# Patient Record
Sex: Female | Born: 1945 | Race: White | Hispanic: No | Marital: Married | Smoking: Never smoker
Health system: Southern US, Community
[De-identification: ages and names within clinical notes are randomized; demographics above are authoritative.]

## PROBLEM LIST (undated history)

## (undated) DIAGNOSIS — E039 Hypothyroidism, unspecified: Secondary | ICD-10-CM

## (undated) DIAGNOSIS — M1990 Unspecified osteoarthritis, unspecified site: Secondary | ICD-10-CM

## (undated) HISTORY — PX: ABDOMINAL HYSTERECTOMY: SHX81

## (undated) HISTORY — PX: NASAL SINUS SURGERY: SHX719

## (undated) HISTORY — PX: OTHER SURGICAL HISTORY: SHX169

## (undated) HISTORY — PX: BUNIONECTOMY: SHX129

## (undated) HISTORY — PX: BACK SURGERY: SHX140

---

## 2000-12-22 ENCOUNTER — Ambulatory Visit (HOSPITAL_COMMUNITY): Admission: RE | Admit: 2000-12-22 | Discharge: 2000-12-22 | Payer: Self-pay | Admitting: Neurosurgery

## 2000-12-22 ENCOUNTER — Encounter: Payer: Self-pay | Admitting: Neurosurgery

## 2001-05-06 ENCOUNTER — Other Ambulatory Visit: Admission: RE | Admit: 2001-05-06 | Discharge: 2001-05-06 | Payer: Self-pay | Admitting: *Deleted

## 2001-05-22 ENCOUNTER — Ambulatory Visit (HOSPITAL_COMMUNITY): Admission: RE | Admit: 2001-05-22 | Discharge: 2001-05-22 | Payer: Self-pay | Admitting: *Deleted

## 2001-05-22 ENCOUNTER — Encounter: Payer: Self-pay | Admitting: *Deleted

## 2002-06-16 ENCOUNTER — Ambulatory Visit (HOSPITAL_COMMUNITY): Admission: RE | Admit: 2002-06-16 | Discharge: 2002-06-16 | Payer: Self-pay | Admitting: *Deleted

## 2002-06-16 ENCOUNTER — Encounter: Payer: Self-pay | Admitting: *Deleted

## 2002-07-13 ENCOUNTER — Encounter: Admission: RE | Admit: 2002-07-13 | Discharge: 2002-07-13 | Payer: Self-pay | Admitting: *Deleted

## 2002-07-13 ENCOUNTER — Encounter: Payer: Self-pay | Admitting: *Deleted

## 2003-08-19 ENCOUNTER — Emergency Department (HOSPITAL_COMMUNITY): Admission: EM | Admit: 2003-08-19 | Discharge: 2003-08-20 | Payer: Self-pay | Admitting: Emergency Medicine

## 2003-08-20 ENCOUNTER — Other Ambulatory Visit (HOSPITAL_COMMUNITY): Admission: RE | Admit: 2003-08-20 | Discharge: 2003-09-01 | Payer: Self-pay | Admitting: Psychiatry

## 2003-09-08 ENCOUNTER — Encounter: Admission: RE | Admit: 2003-09-08 | Discharge: 2003-09-08 | Payer: Self-pay | Admitting: Psychiatry

## 2004-04-02 HISTORY — PX: BREAST ENHANCEMENT SURGERY: SHX7

## 2004-04-02 HISTORY — PX: AUGMENTATION MAMMAPLASTY: SUR837

## 2006-07-24 ENCOUNTER — Encounter: Admission: RE | Admit: 2006-07-24 | Discharge: 2006-07-24 | Payer: Self-pay | Admitting: Obstetrics and Gynecology

## 2006-08-05 ENCOUNTER — Encounter: Admission: RE | Admit: 2006-08-05 | Discharge: 2006-08-05 | Payer: Self-pay | Admitting: Obstetrics and Gynecology

## 2007-09-10 ENCOUNTER — Encounter: Admission: RE | Admit: 2007-09-10 | Discharge: 2007-09-10 | Payer: Self-pay | Admitting: Obstetrics and Gynecology

## 2008-10-20 ENCOUNTER — Ambulatory Visit (HOSPITAL_COMMUNITY): Admission: RE | Admit: 2008-10-20 | Discharge: 2008-10-20 | Payer: Self-pay | Admitting: Family Medicine

## 2009-10-28 ENCOUNTER — Ambulatory Visit: Payer: Self-pay | Admitting: Hematology & Oncology

## 2009-11-11 LAB — CBC WITH DIFFERENTIAL (CANCER CENTER ONLY)
BASO#: 0.1 10*3/uL (ref 0.0–0.2)
BASO%: 0.8 % (ref 0.0–2.0)
EOS%: 1.6 % (ref 0.0–7.0)
Eosinophils Absolute: 0.1 10*3/uL (ref 0.0–0.5)
HCT: 45.6 % (ref 34.8–46.6)
HGB: 15.4 g/dL (ref 11.6–15.9)
LYMPH#: 2 10*3/uL (ref 0.9–3.3)
LYMPH%: 33.2 % (ref 14.0–48.0)
MCH: 31.6 pg (ref 26.0–34.0)
MCHC: 33.7 g/dL (ref 32.0–36.0)
MCV: 94 fL (ref 81–101)
MONO#: 0.4 10*3/uL (ref 0.1–0.9)
MONO%: 5.9 % (ref 0.0–13.0)
NEUT#: 3.5 10*3/uL (ref 1.5–6.5)
NEUT%: 58.5 % (ref 39.6–80.0)
Platelets: 251 10*3/uL (ref 145–400)
RBC: 4.87 10*6/uL (ref 3.70–5.32)
RDW: 12.4 % (ref 10.5–14.6)
WBC: 5.9 10*3/uL (ref 3.9–10.0)

## 2009-11-14 LAB — RETICULOCYTES (CHCC)
ABS Retic: 44.3 10*3/uL (ref 19.0–186.0)
Retic Ct Pct: 0.9 % (ref 0.4–3.1)

## 2009-11-14 LAB — COMPREHENSIVE METABOLIC PANEL
Alkaline Phosphatase: 75 U/L (ref 39–117)
Creatinine, Ser: 0.94 mg/dL (ref 0.40–1.20)
Glucose, Bld: 82 mg/dL (ref 70–99)
Sodium: 140 mEq/L (ref 135–145)
Total Bilirubin: 0.3 mg/dL (ref 0.3–1.2)
Total Protein: 6.8 g/dL (ref 6.0–8.3)

## 2009-11-14 LAB — FERRITIN: Ferritin: 34 ng/mL (ref 10–291)

## 2010-04-24 ENCOUNTER — Encounter: Payer: Self-pay | Admitting: Obstetrics and Gynecology

## 2010-10-03 ENCOUNTER — Other Ambulatory Visit: Payer: Self-pay | Admitting: Family Medicine

## 2010-10-03 ENCOUNTER — Other Ambulatory Visit (HOSPITAL_COMMUNITY): Payer: Self-pay | Admitting: Family Medicine

## 2010-10-03 DIAGNOSIS — Z1231 Encounter for screening mammogram for malignant neoplasm of breast: Secondary | ICD-10-CM

## 2010-10-25 ENCOUNTER — Ambulatory Visit
Admission: RE | Admit: 2010-10-25 | Discharge: 2010-10-25 | Disposition: A | Discharge status: Home / Self Care | Payer: Medicare Other | Source: Ambulatory Visit | Attending: Family Medicine | Admitting: Family Medicine

## 2010-10-25 DIAGNOSIS — Z1231 Encounter for screening mammogram for malignant neoplasm of breast: Secondary | ICD-10-CM

## 2013-07-08 DIAGNOSIS — E7800 Pure hypercholesterolemia, unspecified: Secondary | ICD-10-CM | POA: Insufficient documentation

## 2013-07-08 DIAGNOSIS — G8929 Other chronic pain: Secondary | ICD-10-CM | POA: Insufficient documentation

## 2013-07-08 DIAGNOSIS — E039 Hypothyroidism, unspecified: Secondary | ICD-10-CM | POA: Insufficient documentation

## 2013-07-08 DIAGNOSIS — M1990 Unspecified osteoarthritis, unspecified site: Secondary | ICD-10-CM | POA: Insufficient documentation

## 2014-01-25 DIAGNOSIS — M5440 Lumbago with sciatica, unspecified side: Secondary | ICD-10-CM | POA: Insufficient documentation

## 2014-03-03 DIAGNOSIS — Z9889 Other specified postprocedural states: Secondary | ICD-10-CM | POA: Insufficient documentation

## 2015-02-02 ENCOUNTER — Other Ambulatory Visit (HOSPITAL_COMMUNITY): Payer: Self-pay | Admitting: *Deleted

## 2015-02-02 NOTE — Patient Instructions (Addendum)
Madeline Gray  02/02/2015   Your procedure is scheduled on: 02-22-15  Report to [facility] Main  Entrance take [facility]  elevators to 3rd floor to  Short Stay Center at 1120 AM.  Call this number if you have problems the morning of surgery [phone]   Remember: ONLY 1 PERSON MAY GO WITH YOU TO SHORT STAY TO GET  READY MORNING OF YOUR SURGERY.  Do not eat food  :After Midnight, clear liquids midnight until 820 am day of surgery, nothing by mouth after 820 am day of surgery.    Short Stay will draw your blood type morning of surgery.   Take these medicines the morning of surgery with A SIP OF WATER: Oxycodone if needed              You may not have any metal on your body including hair pins and              piercings  Do not wear jewelry, make-up, lotions, powders or perfumes, deodorant             Do not wear nail polish.  Do not shave  48 hours prior to surgery.              Men may shave face and neck.   Do not bring valuables to the hospital. [place] IS NOT             RESPONSIBLE   FOR VALUABLES.  Contacts, dentures or bridgework may not be worn into surgery.  Leave suitcase in the car. After surgery it may be brought to your room.     Patients discharged the day of surgery will not be allowed to drive home.  Name and phone number of your driver:  Special Instructions: N/A              Please read over the following fact sheets you were given: _____________________________________________________________________                CLEAR LIQUID DIET   Foods Allowed                                                                     Foods Excluded  Coffee and tea, regular and decaf                             liquids that you cannot  Plain Jell-O in any flavor                                             see through such as: Fruit ices (not with fruit pulp)                                     milk, soups, orange juice  Iced Popsicles  All solid food Carbonated beverages, regular and diet                                    Cranberry, grape and apple juices Sports drinks like Gatorade Lightly seasoned clear broth or consume(fat free) Sugar, honey syrup  Sample Menu Breakfast                                Lunch                                     Supper Cranberry juice                    Beef broth                            Chicken broth Jell-O                                     Grape juice                           Apple juice Coffee or tea                        Jell-O                                      Popsicle                                                Coffee or tea                        Coffee or tea  _____________________________________________________________________  [facility] - Preparing for Surgery Before surgery, you can play an important role.  Because skin is not sterile, your skin needs to be as free of germs as possible.  You can reduce the number of germs on your skin by washing with CHG (chlorahexidine gluconate) soap before surgery.  CHG is an antiseptic cleaner which kills germs and bonds with the skin to continue killing germs even after washing. Please DO NOT use if you have an allergy to CHG or antibacterial soaps.  If your skin becomes reddened/irritated stop using the CHG and inform your nurse when you arrive at Short Stay. Do not shave (including legs and underarms) for at least 48 hours prior to the first CHG shower.  You may shave your face/neck. Please follow these instructions carefully:  1.  Shower with CHG Soap the night before surgery and the  morning of Surgery.  2.  If you choose to wash your hair, wash your hair first as usual with your  normal  shampoo.  3.  After you shampoo, rinse your hair and body thoroughly to remove the  shampoo.  4.  Use CHG as you would any other liquid soap.  You can apply chg directly  to the skin and wash                        Gently with a scrungie or clean washcloth.  5.  Apply the CHG Soap to your body ONLY FROM THE NECK DOWN.   Do not use on face/ open                           Wound or open sores. Avoid contact with eyes, ears mouth and genitals (private parts).                       Wash face,  Genitals (private parts) with your normal soap.             6.  Wash thoroughly, paying special attention to the area where your surgery  will be performed.  7.  Thoroughly rinse your body with warm water from the neck down.  8.  DO NOT shower/wash with your normal soap after using and rinsing off  the CHG Soap.                9.  Pat yourself dry with a clean towel.            10.  Wear clean pajamas.            11.  Place clean sheets on your bed the night of your first shower and do not  sleep with pets. Day of Surgery : Do not apply any lotions/deodorants the morning of surgery.  Please wear clean clothes to the hospital/surgery center.  FAILURE TO FOLLOW THESE INSTRUCTIONS MAY RESULT IN THE CANCELLATION OF YOUR SURGERY PATIENT SIGNATURE_________________________________  NURSE SIGNATURE__________________________________  ________________________________________________________________________   [name]  An incentive spirometer is a tool that can help keep your lungs clear and active. This tool measures how well you are filling your lungs with each breath. Taking long deep breaths may help reverse or decrease the chance of developing breathing (pulmonary) problems (especially infection) following:  A long period of time when you are unable to move or be active. BEFORE THE PROCEDURE   If the spirometer includes an indicator to show your best effort, your nurse or respiratory therapist will set it to a desired goal.  If possible, sit up straight or lean slightly forward. Try not to slouch.  Hold the incentive spirometer in an upright position. INSTRUCTIONS FOR USE   Sit  on the edge of your bed if possible, or sit up as far as you can in bed or on a chair.  Hold the incentive spirometer in an upright position.  Breathe out normally.  Place the mouthpiece in your mouth and seal your lips tightly around it.  Breathe in slowly and as deeply as possible, raising the piston or the ball toward the top of the column.  Hold your breath for 3-5 seconds or for as long as possible. Allow the piston or ball to fall to the bottom of the column.  Remove the mouthpiece from your mouth and breathe out normally.  Rest for a few seconds and repeat Steps 1 through 7 at least 10 times every 1-2 hours when you are awake. Take your time and take a few normal breaths between deep breaths.  The spirometer may include an indicator to  show your best effort. Use the indicator as a goal to work toward during each repetition.  After each set of 10 deep breaths, practice coughing to be sure your lungs are clear. If you have an incision (the cut made at the time of surgery), support your incision when coughing by placing a pillow or rolled up towels firmly against it. Once you are able to get out of bed, walk around indoors and cough well. You may stop using the incentive spirometer when instructed by your caregiver.  RISKS AND COMPLICATIONS  Take your time so you do not get dizzy or light-headed.  If you are in pain, you may need to take or ask for pain medication before doing incentive spirometry. It is harder to take a deep breath if you are having pain. AFTER USE  Rest and breathe slowly and easily.  It can be helpful to keep track of a log of your progress. Your caregiver can provide you with a simple table to help with this. If you are using the spirometer at home, follow these instructions: SEEK MEDICAL CARE IF:   You are having difficultly using the spirometer.  You have trouble using the spirometer as often as instructed.  Your pain medication is not giving enough  relief while using the spirometer.  You develop fever of 100.5 F (38.1 C) or higher. SEEK IMMEDIATE MEDICAL CARE IF:   You cough up bloody sputum that had not been present before.  You develop fever of 102 F (38.9 C) or greater.  You develop worsening pain at or near the incision site. MAKE SURE YOU:   Understand these instructions.  Will watch your condition.  Will get help right away if you are not doing well or get worse. Document Released: 07/30/2006 Document Revised: 06/11/2011 Document Reviewed: 09/30/2006 ExitCare Patient Information 2014 ExitCare, [place].   ________________________________________________________________________  WHAT IS A BLOOD TRANSFUSION? Blood Transfusion Information  A transfusion is the replacement of blood or some of its parts. Blood is made up of multiple cells which provide different functions.  Red blood cells carry oxygen and are used for blood loss replacement.  White blood cells fight against infection.  Platelets control bleeding.  Plasma helps clot blood.  Other blood products are available for specialized needs, such as hemophilia or other clotting disorders. BEFORE THE TRANSFUSION  Who gives blood for transfusions?   Healthy volunteers who are fully evaluated to make sure their blood is safe. This is blood bank blood. Transfusion therapy is the safest it has ever been in the practice of medicine. Before blood is taken from a donor, a complete history is taken to make sure that person has no history of diseases nor engages in risky social behavior (examples are intravenous drug use or sexual activity with multiple partners). The donor's travel history is screened to minimize risk of transmitting infections, such as malaria. The donated blood is tested for signs of infectious diseases, such as HIV and hepatitis. The blood is then tested to be sure it is compatible with you in order to minimize the chance of a transfusion reaction. If  you or a relative donates blood, this is often done in anticipation of surgery and is not appropriate for emergency situations. It takes many days to process the donated blood. RISKS AND COMPLICATIONS Although transfusion therapy is very safe and saves many lives, the main dangers of transfusion include:   Getting an infectious disease.  Developing a transfusion reaction. This is an allergic reaction  to something in the blood you were given. Every precaution is taken to prevent this. The decision to have a blood transfusion has been considered carefully by your caregiver before blood is given. Blood is not given unless the benefits outweigh the risks. AFTER THE TRANSFUSION  Right after receiving a blood transfusion, you will usually feel much better and more energetic. This is especially true if your red blood cells have gotten low (anemic). The transfusion raises the level of the red blood cells which carry oxygen, and this usually causes an energy increase.  The nurse administering the transfusion will monitor you carefully for complications. HOME CARE INSTRUCTIONS  No special instructions are needed after a transfusion. You may find your energy is better. Speak with your caregiver about any limitations on activity for underlying diseases you may have. SEEK MEDICAL CARE IF:   Your condition is not improving after your transfusion.  You develop redness or irritation at the intravenous (IV) site. SEEK IMMEDIATE MEDICAL CARE IF:  Any of the following symptoms occur over the next 12 hours:  Shaking chills.  You have a temperature by mouth above 102 F (38.9 C), not controlled by medicine.  Chest, back, or muscle pain.  People around you feel you are not acting correctly or are confused.  Shortness of breath or difficulty breathing.  Dizziness and fainting.  You get a rash or develop hives.  You have a decrease in urine output.  Your urine turns a dark color or changes to pink,  red, or brown. Any of the following symptoms occur over the next 10 days:  You have a temperature by mouth above 102 F (38.9 C), not controlled by medicine.  Shortness of breath.  Weakness after normal activity.  The white part of the eye turns yellow (jaundice).  You have a decrease in the amount of urine or are urinating less often.  Your urine turns a dark color or changes to pink, red, or brown. Document Released: 03/16/2000 Document Revised: 06/11/2011 Document Reviewed: 11/03/2007 [facility] Patient Information 2014 [place], [place].  _______________________________________________________________________

## 2015-02-04 ENCOUNTER — Encounter (HOSPITAL_COMMUNITY)
Admission: RE | Admit: 2015-02-04 | Discharge: 2015-02-04 | Disposition: A | Discharge status: Home / Self Care | Payer: Medicare Other | Source: Ambulatory Visit | Attending: Orthopedic Surgery | Admitting: Orthopedic Surgery

## 2015-02-04 ENCOUNTER — Encounter (HOSPITAL_COMMUNITY): Payer: Self-pay

## 2015-02-04 DIAGNOSIS — Z01818 Encounter for other preprocedural examination: Secondary | ICD-10-CM | POA: Insufficient documentation

## 2015-02-04 DIAGNOSIS — M1612 Unilateral primary osteoarthritis, left hip: Secondary | ICD-10-CM | POA: Diagnosis not present

## 2015-02-04 HISTORY — DX: Unspecified osteoarthritis, unspecified site: M19.90

## 2015-02-04 HISTORY — DX: Hypothyroidism, unspecified: E03.9

## 2015-02-04 LAB — URINALYSIS, ROUTINE W REFLEX MICROSCOPIC
BILIRUBIN URINE: NEGATIVE
Glucose, UA: NEGATIVE mg/dL
HGB URINE DIPSTICK: NEGATIVE
KETONES UR: NEGATIVE mg/dL
NITRITE: NEGATIVE
PH: 6 (ref 5.0–8.0)
Protein, ur: NEGATIVE mg/dL
SPECIFIC GRAVITY, URINE: 1.018 (ref 1.005–1.030)
UROBILINOGEN UA: 0.2 mg/dL (ref 0.0–1.0)

## 2015-02-04 LAB — BASIC METABOLIC PANEL
ANION GAP: 6 (ref 5–15)
BUN: 23 mg/dL — ABNORMAL HIGH (ref 6–20)
CALCIUM: 10 mg/dL (ref 8.9–10.3)
CO2: 31 mmol/L (ref 22–32)
CREATININE: 0.76 mg/dL (ref 0.44–1.00)
Chloride: 105 mmol/L (ref 101–111)
Glucose, Bld: 92 mg/dL (ref 65–99)
Potassium: 5.1 mmol/L (ref 3.5–5.1)
SODIUM: 142 mmol/L (ref 135–145)

## 2015-02-04 LAB — CBC
HCT: 45.7 % (ref 36.0–46.0)
HEMOGLOBIN: 14.6 g/dL (ref 12.0–15.0)
MCH: 30.6 pg (ref 26.0–34.0)
MCHC: 31.9 g/dL (ref 30.0–36.0)
MCV: 95.8 fL (ref 78.0–100.0)
PLATELETS: 247 10*3/uL (ref 150–400)
RBC: 4.77 MIL/uL (ref 3.87–5.11)
RDW: 13.5 % (ref 11.5–15.5)
WBC: 5.3 10*3/uL (ref 4.0–10.5)

## 2015-02-04 LAB — APTT: APTT: 32 s (ref 24–37)

## 2015-02-04 LAB — PROTIME-INR
INR: 0.96 (ref 0.00–1.49)
PROTHROMBIN TIME: 13 s (ref 11.6–15.2)

## 2015-02-04 LAB — SURGICAL PCR SCREEN
MRSA, PCR: NEGATIVE
STAPHYLOCOCCUS AUREUS: NEGATIVE

## 2015-02-04 LAB — URINE MICROSCOPIC-ADD ON

## 2015-02-04 NOTE — H&P (Signed)
TOTAL HIP ADMISSION H&P  Patient is admitted for left total hip arthroplasty, anterior approach.  Subjective:  Chief Complaint:   Left hip primary OA / pain  HPI: Madeline Gray, 69 y.o. female, has a history of pain and functional disability in the left hip(s) due to arthritis and patient has failed non-surgical conservative treatments for greater than 12 weeks to include NSAID's and/or analgesics, corticosteriod injections, supervised PT with diminished ADL's post treatment, use of assistive devices and activity modification.  Onset of symptoms was gradual starting 5-6 years ago with gradually worsening course since that time.The patient noted prior procedures of the hip to include arthroscopy on the left hip(s).  Patient currently rates pain in the left hip at 9 out of 10 with activity. Patient has night pain, worsening of pain with activity and weight bearing, trendelenberg gait, pain that interfers with activities of daily living and pain with passive range of motion. Patient has evidence of periarticular osteophytes and joint space narrowing by imaging studies. This condition presents safety issues increasing the risk of falls.  There is no current active infection.  Risks, benefits and expectations were discussed with the patient.  Risks including but not limited to the risk of anesthesia, blood clots, nerve damage, blood vessel damage, failure of the prosthesis, infection and up to and including death.  Patient understand the risks, benefits and expectations and wishes to proceed with surgery.   PCP: No PCP Per Patient  D/C Plans:      Home with HHPT  Post-op Meds:       No Rx given   Tranexamic Acid:      To be given - IV  Decadron:      Is to be given  FYI:     ASA post-op  Norco post-op     Past Medical History  Diagnosis Date  . Hypothyroidism     no current meds for  . Arthritis     oa    Past Surgical History  Procedure Laterality Date  . Back surgery      L 5 to S 1  rods and screws surgery x 3  . Bunionectomy Bilateral   . Nasal sinus surgery    . Abdominal hysterectomy      complete  . Breast enhancement surgery Bilateral   . Hip arthroscopic surgery Left     No prescriptions prior to admission   Allergies  Allergen Reactions  . Sulfa Antibiotics Nausea Only    Social History  Substance Use Topics  . Smoking status: Never Smoker   . Smokeless tobacco: Never Used  . Alcohol Use: No       Review of Systems  Constitutional: Positive for malaise/fatigue.  HENT: Positive for tinnitus.   Eyes: Negative.   Respiratory: Negative.   Cardiovascular: Negative.   Gastrointestinal: Positive for constipation.  Genitourinary: Positive for urgency and frequency.  Musculoskeletal: Positive for back pain and joint pain.  Skin: Negative.   Neurological: Negative.   Endo/Heme/Allergies: Positive for environmental allergies.  Psychiatric/Behavioral: The patient has insomnia.     Objective:  Physical Exam  Constitutional: She is oriented to person, place, and time. She appears well-developed and well-nourished.  HENT:  Head: Normocephalic and atraumatic.  Eyes: Pupils are equal, round, and reactive to light.  Neck: Neck supple. No JVD present. No tracheal deviation present. No thyromegaly present.  Cardiovascular: Normal rate, regular rhythm, normal heart sounds and intact distal pulses.   Respiratory: Effort normal and breath sounds  normal. No stridor. No respiratory distress. She has no wheezes.  GI: Soft. There is no tenderness. There is no guarding.  Musculoskeletal:       Left hip: She exhibits decreased range of motion, decreased strength, tenderness and bony tenderness. She exhibits no swelling, no deformity and no laceration.  Lymphadenopathy:    She has no cervical adenopathy.  Neurological: She is alert and oriented to person, place, and time.  Skin: Skin is warm and dry.  Psychiatric: She has a normal mood and affect.       Imaging Review Plain radiographs demonstrate severe degenerative joint disease of the left hip(s). The bone quality appears to be good for age and reported activity level.  Assessment/Plan:  End stage arthritis, left hip(s)  The patient history, physical examination, clinical judgement of the provider and imaging studies are consistent with end stage degenerative joint disease of the left hip(s) and total hip arthroplasty is deemed medically necessary. The treatment options including medical management, injection therapy, arthroscopy and arthroplasty were discussed at length. The risks and benefits of total hip arthroplasty were presented and reviewed. The risks due to aseptic loosening, infection, stiffness, dislocation/subluxation,  thromboembolic complications and other imponderables were discussed.  The patient acknowledged the explanation, agreed to proceed with the plan and consent was signed. Patient is being admitted for inpatient treatment for surgery, pain control, PT, OT, prophylactic antibiotics, VTE prophylaxis, progressive ambulation and ADL's and discharge planning.The patient is planning to be discharged home with home health services .     Anastasio AuerbachMatthew S. Miyako Oelke   PA-C  Madeline Gray, Madeline Gray

## 2015-02-09 NOTE — Progress Notes (Signed)
ekg  11-29-14 [facility] on chart

## 2015-02-22 ENCOUNTER — Inpatient Hospital Stay (HOSPITAL_COMMUNITY): Payer: Medicare Other

## 2015-02-22 ENCOUNTER — Encounter (HOSPITAL_COMMUNITY): Payer: Self-pay | Admitting: *Deleted

## 2015-02-22 ENCOUNTER — Encounter (HOSPITAL_COMMUNITY)
Admission: RE | Disposition: A | Discharge status: Home Health | Payer: Self-pay | Source: Ambulatory Visit | Attending: Orthopedic Surgery

## 2015-02-22 ENCOUNTER — Inpatient Hospital Stay (HOSPITAL_COMMUNITY): Payer: Medicare Other | Admitting: Registered Nurse

## 2015-02-22 ENCOUNTER — Inpatient Hospital Stay (HOSPITAL_COMMUNITY)
Admission: RE | Admit: 2015-02-22 | Discharge: 2015-02-24 | DRG: 470 | Disposition: A | Discharge status: Home Health | Payer: Medicare Other | Source: Ambulatory Visit | Attending: Orthopedic Surgery | Admitting: Orthopedic Surgery

## 2015-02-22 DIAGNOSIS — M25552 Pain in left hip: Secondary | ICD-10-CM | POA: Diagnosis present

## 2015-02-22 DIAGNOSIS — Z01812 Encounter for preprocedural laboratory examination: Secondary | ICD-10-CM | POA: Diagnosis not present

## 2015-02-22 DIAGNOSIS — E039 Hypothyroidism, unspecified: Secondary | ICD-10-CM | POA: Diagnosis present

## 2015-02-22 DIAGNOSIS — M1612 Unilateral primary osteoarthritis, left hip: Principal | ICD-10-CM | POA: Diagnosis present

## 2015-02-22 DIAGNOSIS — Z96649 Presence of unspecified artificial hip joint: Secondary | ICD-10-CM

## 2015-02-22 HISTORY — PX: TOTAL HIP ARTHROPLASTY: SHX124

## 2015-02-22 LAB — TYPE AND SCREEN
ABO/RH(D): A POS
Antibody Screen: NEGATIVE

## 2015-02-22 LAB — ABO/RH: ABO/RH(D): A POS

## 2015-02-22 SURGERY — ARTHROPLASTY, HIP, TOTAL, ANTERIOR APPROACH
Anesthesia: Spinal | Site: Hip | Laterality: Left

## 2015-02-22 MED ORDER — DEXAMETHASONE SODIUM PHOSPHATE 10 MG/ML IJ SOLN
INTRAMUSCULAR | Status: AC
Start: 2015-02-22 — End: 2015-02-22
  Filled 2015-02-22: qty 1

## 2015-02-22 MED ORDER — MIDAZOLAM HCL 5 MG/5ML IJ SOLN
INTRAMUSCULAR | Status: DC | PRN
  Administered 2015-02-22: 2 mg via INTRAVENOUS

## 2015-02-22 MED ORDER — DIPHENHYDRAMINE HCL 25 MG PO CAPS: 25.0000 mg | ORAL_CAPSULE | Freq: Four times a day (QID) | ORAL | Status: DC | PRN

## 2015-02-22 MED ORDER — PROPOFOL 500 MG/50ML IV EMUL
INTRAVENOUS | Status: DC | PRN
  Administered 2015-02-22: 25 ug/kg/min via INTRAVENOUS

## 2015-02-22 MED ORDER — HYDROMORPHONE HCL 1 MG/ML IJ SOLN
INTRAMUSCULAR | Status: AC
  Filled 2015-02-22: qty 1

## 2015-02-22 MED ORDER — BISACODYL 10 MG RE SUPP: 10.0000 mg | Freq: Every day | RECTAL | Status: DC | PRN

## 2015-02-22 MED ORDER — FENTANYL CITRATE (PF) 100 MCG/2ML IJ SOLN
INTRAMUSCULAR | Status: AC
  Filled 2015-02-22: qty 2

## 2015-02-22 MED ORDER — FENTANYL CITRATE (PF) 100 MCG/2ML IJ SOLN
INTRAMUSCULAR | Status: DC | PRN
  Administered 2015-02-22 (×4): 25 ug via INTRAVENOUS
  Administered 2015-02-22: 50 ug via INTRAVENOUS

## 2015-02-22 MED ORDER — DEXAMETHASONE SODIUM PHOSPHATE 10 MG/ML IJ SOLN
10.0000 mg | Freq: Once | INTRAMUSCULAR | Status: AC
  Administered 2015-02-23: 10 mg via INTRAVENOUS
  Filled 2015-02-22: qty 1

## 2015-02-22 MED ORDER — EPHEDRINE SULFATE 50 MG/ML IJ SOLN
INTRAMUSCULAR | Status: DC | PRN
  Administered 2015-02-22: 5 mg via INTRAVENOUS

## 2015-02-22 MED ORDER — METOCLOPRAMIDE HCL 5 MG/ML IJ SOLN
5.0000 mg | Freq: Three times a day (TID) | INTRAMUSCULAR | Status: DC | PRN
  Administered 2015-02-23: 10 mg via INTRAVENOUS
  Filled 2015-02-22: qty 2

## 2015-02-22 MED ORDER — MEPERIDINE HCL 50 MG/ML IJ SOLN: 6.2500 mg | INTRAMUSCULAR | Status: DC | PRN

## 2015-02-22 MED ORDER — SODIUM CHLORIDE 0.9 % IR SOLN
Status: DC | PRN
  Administered 2015-02-22: 1000 mL

## 2015-02-22 MED ORDER — PROPOFOL 10 MG/ML IV BOLUS
INTRAVENOUS | Status: AC
  Filled 2015-02-22: qty 20

## 2015-02-22 MED ORDER — ONDANSETRON HCL 4 MG/2ML IJ SOLN
INTRAMUSCULAR | Status: DC | PRN
  Administered 2015-02-22: 4 mg via INTRAVENOUS

## 2015-02-22 MED ORDER — ONDANSETRON HCL 4 MG/2ML IJ SOLN: 4.0000 mg | Freq: Once | INTRAMUSCULAR | Status: DC | PRN

## 2015-02-22 MED ORDER — LIDOCAINE HCL (CARDIAC) 20 MG/ML IV SOLN
INTRAVENOUS | Status: DC | PRN
  Administered 2015-02-22: 100 mg via INTRAVENOUS

## 2015-02-22 MED ORDER — ONDANSETRON HCL 4 MG PO TABS: 4.0000 mg | ORAL_TABLET | Freq: Four times a day (QID) | ORAL | Status: DC | PRN

## 2015-02-22 MED ORDER — METHOCARBAMOL 1000 MG/10ML IJ SOLN
500.0000 mg | Freq: Four times a day (QID) | INTRAVENOUS | Status: DC | PRN
  Administered 2015-02-22: 500 mg via INTRAVENOUS
  Filled 2015-02-22 (×2): qty 5

## 2015-02-22 MED ORDER — HYDROMORPHONE HCL 1 MG/ML IJ SOLN
0.5000 mg | INTRAMUSCULAR | Status: DC | PRN
  Administered 2015-02-22 (×2): 0.5 mg via INTRAVENOUS
  Filled 2015-02-22: qty 1

## 2015-02-22 MED ORDER — TRANEXAMIC ACID 1000 MG/10ML IV SOLN
1000.0000 mg | Freq: Once | INTRAVENOUS | Status: AC
  Administered 2015-02-22: 1000 mg via INTRAVENOUS
  Filled 2015-02-22: qty 10

## 2015-02-22 MED ORDER — DOCUSATE SODIUM 100 MG PO CAPS
100.0000 mg | ORAL_CAPSULE | Freq: Two times a day (BID) | ORAL | Status: DC
  Administered 2015-02-22 – 2015-02-24 (×4): 100 mg via ORAL

## 2015-02-22 MED ORDER — GABAPENTIN 300 MG PO CAPS
300.0000 mg | ORAL_CAPSULE | Freq: Every day | ORAL | Status: DC
  Administered 2015-02-22 – 2015-02-23 (×2): 300 mg via ORAL
  Filled 2015-02-22 (×3): qty 1

## 2015-02-22 MED ORDER — BIOTIN 10 MG PO CAPS: 10.0000 mg | ORAL_CAPSULE | Freq: Every day | ORAL | Status: DC

## 2015-02-22 MED ORDER — CEFAZOLIN SODIUM-DEXTROSE 2-3 GM-% IV SOLR
INTRAVENOUS | Status: AC
  Filled 2015-02-22: qty 50

## 2015-02-22 MED ORDER — MAGNESIUM CITRATE PO SOLN: 1.0000 | Freq: Once | ORAL | Status: DC | PRN

## 2015-02-22 MED ORDER — HYDROCODONE-ACETAMINOPHEN 7.5-325 MG PO TABS
1.0000 | ORAL_TABLET | ORAL | Status: DC
  Administered 2015-02-22 – 2015-02-23 (×4): 2 via ORAL
  Administered 2015-02-23: 1 via ORAL
  Administered 2015-02-23 – 2015-02-24 (×4): 2 via ORAL
  Filled 2015-02-22 (×3): qty 2
  Filled 2015-02-22 (×2): qty 1
  Filled 2015-02-22 (×2): qty 2
  Filled 2015-02-22: qty 1
  Filled 2015-02-22 (×3): qty 2

## 2015-02-22 MED ORDER — POLYETHYLENE GLYCOL 3350 17 G PO PACK
17.0000 g | PACK | Freq: Two times a day (BID) | ORAL | Status: DC
  Administered 2015-02-23 – 2015-02-24 (×3): 17 g via ORAL

## 2015-02-22 MED ORDER — DEXAMETHASONE SODIUM PHOSPHATE 10 MG/ML IJ SOLN
10.0000 mg | Freq: Once | INTRAMUSCULAR | Status: AC
  Administered 2015-02-22: 10 mg via INTRAVENOUS

## 2015-02-22 MED ORDER — ASPIRIN EC 325 MG PO TBEC
325.0000 mg | DELAYED_RELEASE_TABLET | Freq: Two times a day (BID) | ORAL | Status: DC
  Administered 2015-02-23 – 2015-02-24 (×3): 325 mg via ORAL
  Filled 2015-02-22 (×5): qty 1

## 2015-02-22 MED ORDER — CEFAZOLIN SODIUM-DEXTROSE 2-3 GM-% IV SOLR
2.0000 g | INTRAVENOUS | Status: AC
  Administered 2015-02-22: 2 g via INTRAVENOUS

## 2015-02-22 MED ORDER — PROPOFOL 10 MG/ML IV BOLUS
INTRAVENOUS | Status: AC
Start: 2015-02-22 — End: 2015-02-22
  Filled 2015-02-22: qty 20

## 2015-02-22 MED ORDER — ONDANSETRON HCL 4 MG/2ML IJ SOLN
INTRAMUSCULAR | Status: AC
  Filled 2015-02-22: qty 2

## 2015-02-22 MED ORDER — METHOCARBAMOL 500 MG PO TABS
500.0000 mg | ORAL_TABLET | Freq: Four times a day (QID) | ORAL | Status: DC | PRN
  Administered 2015-02-22 – 2015-02-23 (×2): 500 mg via ORAL
  Filled 2015-02-22 (×2): qty 1

## 2015-02-22 MED ORDER — MENTHOL 3 MG MT LOZG: 1.0000 | LOZENGE | OROMUCOSAL | Status: DC | PRN

## 2015-02-22 MED ORDER — BUPIVACAINE HCL (PF) 0.5 % IJ SOLN
INTRAMUSCULAR | Status: AC
  Filled 2015-02-22: qty 30

## 2015-02-22 MED ORDER — ALUM & MAG HYDROXIDE-SIMETH 200-200-20 MG/5ML PO SUSP: 30.0000 mL | ORAL | Status: DC | PRN

## 2015-02-22 MED ORDER — HYDROMORPHONE HCL 1 MG/ML IJ SOLN
0.2500 mg | INTRAMUSCULAR | Status: DC | PRN
  Administered 2015-02-22 (×4): 0.5 mg via INTRAVENOUS

## 2015-02-22 MED ORDER — METOCLOPRAMIDE HCL 10 MG PO TABS: 5.0000 mg | ORAL_TABLET | Freq: Three times a day (TID) | ORAL | Status: DC | PRN

## 2015-02-22 MED ORDER — MIDAZOLAM HCL 2 MG/2ML IJ SOLN
INTRAMUSCULAR | Status: AC
  Filled 2015-02-22: qty 2

## 2015-02-22 MED ORDER — PROPOFOL 10 MG/ML IV BOLUS
INTRAVENOUS | Status: DC | PRN
  Administered 2015-02-22: 100 mg via INTRAVENOUS

## 2015-02-22 MED ORDER — LACTATED RINGERS IV SOLN
INTRAVENOUS | Status: DC
  Administered 2015-02-22: 1000 mL via INTRAVENOUS

## 2015-02-22 MED ORDER — ONDANSETRON HCL 4 MG/2ML IJ SOLN
4.0000 mg | Freq: Four times a day (QID) | INTRAMUSCULAR | Status: DC | PRN
  Administered 2015-02-22 – 2015-02-23 (×2): 4 mg via INTRAVENOUS
  Filled 2015-02-22 (×2): qty 2

## 2015-02-22 MED ORDER — PHENOL 1.4 % MT LIQD: 1.0000 | OROMUCOSAL | Status: DC | PRN

## 2015-02-22 MED ORDER — FERROUS SULFATE 325 (65 FE) MG PO TABS
325.0000 mg | ORAL_TABLET | Freq: Three times a day (TID) | ORAL | Status: DC
  Administered 2015-02-23 – 2015-02-24 (×2): 325 mg via ORAL
  Filled 2015-02-22 (×7): qty 1

## 2015-02-22 MED ORDER — CEFAZOLIN SODIUM-DEXTROSE 2-3 GM-% IV SOLR
2.0000 g | Freq: Four times a day (QID) | INTRAVENOUS | Status: AC
  Administered 2015-02-22 – 2015-02-23 (×2): 2 g via INTRAVENOUS
  Filled 2015-02-22 (×2): qty 50

## 2015-02-22 MED ORDER — SODIUM CHLORIDE 0.9 % IV SOLN
100.0000 mL/h | INTRAVENOUS | Status: DC
  Administered 2015-02-22 – 2015-02-23 (×2): 100 mL/h via INTRAVENOUS
  Filled 2015-02-22 (×6): qty 1000

## 2015-02-22 SURGICAL SUPPLY — 34 items
BAG DECANTER FOR FLEXI CONT (MISCELLANEOUS) IMPLANT
BAG SPEC THK2 15X12 ZIP CLS (MISCELLANEOUS)
BAG ZIPLOCK 12X15 (MISCELLANEOUS) IMPLANT
CAPT HIP TOTAL 2 ×1 IMPLANT
CLOTH BEACON ORANGE TIMEOUT ST (SAFETY) ×2 IMPLANT
COVER PERINEAL POST (MISCELLANEOUS) ×2 IMPLANT
DRAPE STERI IOBAN 125X83 (DRAPES) ×2 IMPLANT
DRAPE U-SHAPE 47X51 STRL (DRAPES) ×4 IMPLANT
DRSG AQUACEL AG ADV 3.5X10 (GAUZE/BANDAGES/DRESSINGS) ×2 IMPLANT
DURAPREP 26ML APPLICATOR (WOUND CARE) ×2 IMPLANT
ELECT REM PT RETURN 15FT ADLT (MISCELLANEOUS) IMPLANT
ELECT REM PT RETURN 9FT ADLT (ELECTROSURGICAL) ×2
ELECTRODE REM PT RTRN 9FT ADLT (ELECTROSURGICAL) ×1 IMPLANT
GLOVE BIOGEL M 7.0 STRL (GLOVE) IMPLANT
GLOVE BIOGEL M STRL SZ7.5 (GLOVE) IMPLANT
GLOVE BIOGEL PI IND STRL 7.5 (GLOVE) ×1 IMPLANT
GLOVE BIOGEL PI IND STRL 8.5 (GLOVE) ×1 IMPLANT
GLOVE BIOGEL PI INDICATOR 7.5 (GLOVE) ×1
GLOVE BIOGEL PI INDICATOR 8.5 (GLOVE) ×1
GLOVE ECLIPSE 8.0 STRL XLNG CF (GLOVE) ×4 IMPLANT
GLOVE ORTHO TXT STRL SZ7.5 (GLOVE) ×2 IMPLANT
GOWN STRL REUS W/TWL LRG LVL3 (GOWN DISPOSABLE) ×2 IMPLANT
GOWN STRL REUS W/TWL XL LVL3 (GOWN DISPOSABLE) ×2 IMPLANT
HOLDER FOLEY CATH W/STRAP (MISCELLANEOUS) ×2 IMPLANT
LIQUID BAND (GAUZE/BANDAGES/DRESSINGS) ×2 IMPLANT
PACK ANTERIOR HIP CUSTOM (KITS) ×2 IMPLANT
SAW OSC TIP CART 19.5X105X1.3 (SAW) ×2 IMPLANT
SUT MNCRL AB 4-0 PS2 18 (SUTURE) ×2 IMPLANT
SUT VIC AB 1 CT1 36 (SUTURE) ×6 IMPLANT
SUT VIC AB 2-0 CT1 27 (SUTURE) ×4
SUT VIC AB 2-0 CT1 TAPERPNT 27 (SUTURE) ×2 IMPLANT
SUT VLOC 180 0 24IN GS25 (SUTURE) ×2 IMPLANT
TRAY FOLEY W/METER SILVER 14FR (SET/KITS/TRAYS/PACK) IMPLANT
WATER STERILE IRR 1500ML POUR (IV SOLUTION) ×2 IMPLANT

## 2015-02-22 NOTE — Anesthesia Preprocedure Evaluation (Addendum)
Anesthesia Evaluation  Patient identified by MRN, date of birth, ID band Patient awake    Reviewed: Allergy & Precautions, NPO status , Patient's Chart, lab work & pertinent test results  Airway Mallampati: I  TM Distance: >3 FB Neck ROM: Full    Dental   Pulmonary    Pulmonary exam normal        Cardiovascular Normal cardiovascular exam     Neuro/Psych    GI/Hepatic   Endo/Other  Hypothyroidism   Renal/GU      Musculoskeletal   Abdominal   Peds  Hematology   Anesthesia Other Findings   Reproductive/Obstetrics                           Anesthesia Physical Anesthesia Plan  ASA: II  Anesthesia Plan: Spinal   Post-op Pain Management:    Induction: Intravenous  Airway Management Planned: Simple Face Mask  Additional Equipment:   Intra-op Plan:   Post-operative Plan:   Informed Consent: I have reviewed the patients History and Physical, chart, labs and discussed the procedure including the risks, benefits and alternatives for the proposed anesthesia with the patient or authorized representative who has indicated his/her understanding and acceptance.     Plan Discussed with: CRNA and Surgeon  Anesthesia Plan Comments:         Anesthesia Quick Evaluation

## 2015-02-22 NOTE — Transfer of Care (Signed)
Immediate Anesthesia Transfer of Care Note  Patient: Madeline Gray  Procedure(s) Performed: Procedure(s): TOTAL LEFT HIP ARTHROPLASTY ANTERIOR APPROACH (Left)  Patient Location: PACU  Anesthesia Type:General  Level of Consciousness: awake, alert , oriented and patient cooperative  Airway & Oxygen Therapy: Patient Spontanous Breathing and Patient connected to face mask oxygen  Post-op Assessment: Report given to RN, Post -op Vital signs reviewed and stable and Patient moving all extremities  Post vital signs: Reviewed and stable  Last Vitals:  Filed Vitals:   02/22/15 1112  BP: 180/89  Pulse: 73  Temp: 36.6 C  Resp: 18    Complications: No apparent anesthesia complications

## 2015-02-22 NOTE — Discharge Instructions (Signed)

## 2015-02-22 NOTE — Interval H&P Note (Signed)
History and Physical Interval Note:  02/22/2015 12:55 PM  Madeline Gray  has presented today for surgery, with the diagnosis of LEFT HIP OA  The various methods of treatment have been discussed with the patient and family. After consideration of risks, benefits and other options for treatment, the patient has consented to  Procedure(s): TOTAL LEFT HIP ARTHROPLASTY ANTERIOR APPROACH (Left) as a surgical intervention .  The patient's history has been reviewed, patient examined, no change in status, stable for surgery.  I have reviewed the patient's chart and labs.  Questions were answered to the patient's satisfaction.     Shelda PalLIN,Gittel Mccamish D

## 2015-02-22 NOTE — Anesthesia Procedure Notes (Addendum)
Procedure Name: MAC Date/Time: 02/22/2015 1:41 PM Performed by: Jarvis NewcomerARMISTEAD, Deanglo Hissong A Pre-anesthesia Checklist: Patient identified, Timeout performed, Emergency Drugs available, Suction available and Patient being monitored Patient Re-evaluated:Patient Re-evaluated prior to inductionOxygen Delivery Method: Simple face mask Dental Injury: Teeth and Oropharynx as per pre-operative assessment    Procedure Name: LMA Insertion Date/Time: 02/22/2015 2:37 PM Performed by: Jarvis NewcomerARMISTEAD, Betta Balla A Pre-anesthesia Checklist: Patient identified, Timeout performed, Emergency Drugs available, Suction available and Patient being monitored Patient Re-evaluated:Patient Re-evaluated prior to inductionOxygen Delivery Method: Circle system utilized Preoxygenation: Pre-oxygenation with 100% oxygen Intubation Type: IV induction Ventilation: Mask ventilation without difficulty LMA: LMA with gastric port inserted LMA Size: 3.0 Number of attempts: 1 Placement Confirmation: positive ETCO2 and breath sounds checked- equal and bilateral Tube secured with: Tape Dental Injury: Teeth and Oropharynx as per pre-operative assessment

## 2015-02-22 NOTE — Anesthesia Postprocedure Evaluation (Signed)
Anesthesia Post Note  Patient: Madeline Gray  Procedure(s) Performed: Procedure(s) (LRB): TOTAL LEFT HIP ARTHROPLASTY ANTERIOR APPROACH (Left)  Patient location during evaluation: PACU Anesthesia Type: General Level of consciousness: awake and alert Pain management: pain level controlled Vital Signs Assessment: post-procedure vital signs reviewed and stable Respiratory status: spontaneous breathing, nonlabored ventilation, respiratory function stable and patient connected to nasal cannula oxygen Cardiovascular status: blood pressure returned to baseline and stable Postop Assessment: No signs of nausea or vomiting Anesthetic complications: no    Last Vitals:  Filed Vitals:   02/22/15 1700 02/22/15 1802  BP: 140/66 138/66  Pulse: 65 62  Temp: 36.3 C 36.3 C  Resp: 16 15    Last Pain:  Filed Vitals:   02/22/15 1803  PainSc: 4     LLE Motor Response: Purposeful movement LLE Sensation: Decreased RLE Motor Response: Purposeful movement RLE Sensation: Decreased L Sensory Level: L3-Anterior knee, lower leg R Sensory Level: L3-Anterior knee, lower leg  Erich Kochan DAVID

## 2015-02-22 NOTE — Op Note (Signed)
NAME:  Madeline Gray                ACCOUNT NO.: [id number]      MEDICAL RECORD NO.: [id number]      FACILITY:  [facility]      PHYSICIAN:  Durene Romans D  DATE OF BIRTH:  06/22/1945     DATE OF PROCEDURE:  02/22/2015                                 OPERATIVE REPORT         PREOPERATIVE DIAGNOSIS: Left  hip osteoarthritis.      POSTOPERATIVE DIAGNOSIS:  Left hip osteoarthritis.      PROCEDURE:  Left total hip replacement through an anterior approach   utilizing DePuy THR system, component size 50mm pinnacle cup, a size 32+4 neutral   Altrex liner, a size 4 standard Tri Lock stem with a 32+1 delta ceramic   ball.      SURGEON:  Madlyn Frankel. Charlann Boxer, M.D.      ASSISTANT:  Skip Mayer, PA-C     ANESTHESIA:  General and Spinal.      SPECIMENS:  None.      COMPLICATIONS:  None.      BLOOD LOSS:  400 cc     DRAINS:  None.      INDICATION OF THE PROCEDURE:  Madeline Gray is a 69 y.o. female who had   presented to office for evaluation of left hip pain.  Radiographs revealed   progressive degenerative changes with bone-on-bone   articulation to the  hip joint.  The patient had painful limited range of   motion significantly affecting their overall quality of life.  The patient was failing to    respond to conservative measures, and at this point was ready   to proceed with more definitive measures.  The patient has noted progressive   degenerative changes in his hip, progressive problems and dysfunction   with regarding the hip prior to surgery.  Consent was obtained for   benefit of pain relief.  Specific risk of infection, DVT, component   failure, dislocation, need for revision surgery, as well discussion of   the anterior versus posterior approach were reviewed.  Consent was   obtained for benefit of anterior pain relief through an anterior   approach.      PROCEDURE IN DETAIL:  The patient was brought to operative theater.   Once adequate  anesthesia, preoperative antibiotics, 2gm of Ancef 10 mg of Decadron administered.   The patient was positioned supine on the OSI Hanna table.  Once adequate   padding of boney process was carried out, we had predraped out the hip, and  used fluoroscopy to confirm orientation of the pelvis and position.      The left hip was then prepped and draped from proximal iliac crest to   mid thigh with shower curtain technique.      Time-out was performed identifying the patient, planned procedure, and   extremity.     An incision was then made 2 cm distal and lateral to the   anterior superior iliac spine extending over the orientation of the   tensor fascia lata muscle and sharp dissection was carried down to the   fascia of the muscle and protractor placed in the soft tissues.      The fascia was then incised.  The  muscle belly was identified and swept   laterally and retractor placed along the superior neck.  Following   cauterization of the circumflex vessels and removing some pericapsular   fat, a second cobra retractor was placed on the inferior neck.  A third   retractor was placed on the anterior acetabulum after elevating the   anterior rectus.  A L-capsulotomy was along the line of the   superior neck to the trochanteric fossa, then extended proximally and   distally.  Tag sutures were placed and the retractors were then placed   intracapsular.  We then identified the trochanteric fossa and   orientation of my neck cut, confirmed this radiographically   and then made a neck osteotomy with the femur on traction.  The femoral   head was removed without difficulty or complication.  Traction was let   off and retractors were placed posterior and anterior around the   acetabulum.      The labrum and foveal tissue were debrided.  I began reaming with a [phone]-FrencMarykay 811[place] SoFransico Mich5Larose [phone]ed up to 26mmMiddlesex EndoscPurv8iW0J8Brooklyn Hospi63mal828-183-FrencMarykay 31[place] SoFransico Mich5Larose [phone]rn HaTheda23m70Va Medical CePurv24iW0J8Central Okl3mho339-830-FrencMarykay 3[place] SoFransico Mich5Larose [phone] [address] CenZK23m[address] SurgPurv82iW0J8C[facility] Of [phone]-FrencMarykay 84[place] SoFransico Mich5Larose H(57m02619-564-FrencMarykay 71[place] SoFransico Mich5Larose [phone]ge Courtey58mHaGarden Park Purv49iW0J8Mai57mon707 270 FrencMarykay [place] SoFransico Mich5Larose H228 95621So78 E. Wayne LaneckSouth Ka46msaDoctors MemPurv38iW0J8[facility] Gene52mal639096FrencMarykay 5[place] SoFransico Mich5Larose [phone]MemHaTheda46m59m0F409-135-FrencMarykay 19[place] SoFransico Mich5Larose [phone]J8Surgicar56m SNaples Eye Purv86iW0J8Lake Regional [phone] 194-FrencMarykay 70[place] SoFransico Mich5Larose H504 95621So9[address]estHaTheda69m30Highland Purv38iW0J8Le Bon[facility] Children'S HZKentuckNathan LitHaTheda 6553m4dMary WashiPurv88iW0J8Cinc[facility] Children'[facility] Center At [phone] 3[place] SoFransico Mich5Larose [phone]eroHaTheda4m88DoylePurv33iW0J8Centura72mHe325-228-FrencMarykay 89[place] SoFransico Mich5Larose [phone] L[address] [place] Streetis55m H808 315 FrencMarykay 5[place] SoFransico Mich5Larose [phone] E. [address] Lanes Alamos M47mdiLv SPurv61iW0J8[facility] PeZKentuckPeacehealth Cottage Grove CommHaTh97mdaTexas Health Surgery Center [facility] Purv36iW0J8Lower Bucks HZKentuckAdventist Health Sonor34m R332-317-FrencMarykay 33[place] SoFransico Mich5Larose [phone] Pleasant Roadnter D/P S74m5m435-866-FrencMarykay 11[place] SoFransico Mich5Larose [phone] [address]W0J8Lane F87mosBronx Va Purv57iW0J8Carrus Specia89mty718-112-FrencMarykay 3[place] SoFransico Mich5Larose [phone] East Magn[address] CourtDisHaTheda48m29[place] SurgPurv29iW0J8Kerr42milCovenant HospPurv66iW0J8Kindred Hospital - ForZKentuckAh26mc [phone]-FrencMarykay 41[place] SoFransico Mich5Larose [phone]aTheda 11w56mdfSafety Harbor [facility] Purv55iW0J8[facility] [phone] 69[place] SoFransico Mich5Larose [phone]e SuHaThed31m 6[facility] Am46mul580-679-FrencMarykay 40[place] SoFransico Mich5Larose [phone] [place] Lan86m0J949-336-FrencMarykay 1[place] SoFransico Mich5Larose H724 95621So176 [address]n Valley A73mbuThomas Jefferson UnivePurv89iW0J8Lifecare Hospitals Of ShrZKentuckIllinois Sports 50med313-051-FrencMarykay 69[place] SoFransico Mich5Larose H469 95621So[address].ic HaTheda56m28Corpus Christi EndoscPurv4iW0J8Tew[place]bury HZKentuckKansas Ci57my 912 682 FrencMarykay 53[place] SoFransico Mich5Larose [phone] [phone]-Fre42mcM623-467-FrencMarykay 2[place] SoFransico Mich5Larose H2695621So308 [address] Dyke StreetoFransico 49micCommunity HospiPurv63iW0J8Saint Barnabas MedicalZKenLattie HawuHaTheda 63w4dfastustri63Roselyn BerinBienville Surgery Cente285Spring Gr52ove tion with respect to   abduction.  A screw was placed followed by the hole eliminator.  The final   32+4 neutral Altrex liner was impacted with good visualized rim fit.  The cup was positioned anatomically within the acetabular portion of the pelvis.      At this point, the femur was rolled at 80 degrees.  Further capsule was   released off the inferior aspect of the femoral neck.  I then   released the superior capsule proximally.  The hook was placed laterally   along the femur and elevated manually and held in position with the bed   hook.  The leg was then extended and adducted with the leg rolled to 100   degrees of external rotation.  Once the proximal femur was fully   exposed, I used a box osteotome to set orientation.  I then began   broaching with the starting chili pepper broach and passed this by hand and then broached up to 4.  With the 4 broach in place I chose a standard offset neck and did a trial reduction.  The offset was appropriate, leg lengths   appeared to be equal, confirmed radiographically.   Given these findings, I went ahead and dislocated the hip, repositioned all   retractors and positioned the right hip in the extended and abducted position.  The final 4 standard Tri Lock stem was   chosen and it was impacted down to the level of neck cut.  Based on this   and the trial reduction, a 32+1 delta ceramic ball was chosen and   impacted onto a clean and dry trunnion, and the hip was reduced.  The   hip had been irrigated throughout the case again at this point.  I did   reapproximate the superior capsular leaflet to the anterior leaflet   using #1 Vicryl.  The fascia of the   tensor fascia lata muscle was then reapproximated using #1 Vicryl and #0 V-lock sutures.  The   remaining wound was closed with 2-0 Vicryl and running 4-0 Monocryl.   The hip was cleaned, dried, and dressed sterilely  using Dermabond and   Aquacel dressing.  She  was then brought   to recovery room in stable condition tolerating the procedure well.    Skip Mayer, PA-C was present for the entirety of the case involved from   preoperative positioning, perioperative retractor management, general   facilitation of the case, as well as primary wound closure as assistant.            Madlyn Frankel Charlann Boxer, M.D.        02/22/2015 4:37 PM

## 2015-02-23 ENCOUNTER — Encounter (HOSPITAL_COMMUNITY): Payer: Self-pay | Admitting: Orthopedic Surgery

## 2015-02-23 LAB — CBC
HEMATOCRIT: 34.7 % — AB (ref 36.0–46.0)
HEMOGLOBIN: 11.6 g/dL — AB (ref 12.0–15.0)
MCH: 31.3 pg (ref 26.0–34.0)
MCHC: 33.4 g/dL (ref 30.0–36.0)
MCV: 93.5 fL (ref 78.0–100.0)
Platelets: 186 10*3/uL (ref 150–400)
RBC: 3.71 MIL/uL — AB (ref 3.87–5.11)
RDW: 13.4 % (ref 11.5–15.5)
WBC: 7.2 10*3/uL (ref 4.0–10.5)

## 2015-02-23 LAB — BASIC METABOLIC PANEL
ANION GAP: 4 — AB (ref 5–15)
BUN: 12 mg/dL (ref 6–20)
CHLORIDE: 108 mmol/L (ref 101–111)
CO2: 28 mmol/L (ref 22–32)
Calcium: 8.8 mg/dL — ABNORMAL LOW (ref 8.9–10.3)
Creatinine, Ser: 0.72 mg/dL (ref 0.44–1.00)
GFR calc non Af Amer: >60 mL/min (ref >60)
Glucose, Bld: 126 mg/dL — ABNORMAL HIGH (ref 65–99)
Potassium: 5.1 mmol/L (ref 3.5–5.1)
SODIUM: 140 mmol/L (ref 135–145)

## 2015-02-23 MED ORDER — ASPIRIN 325 MG PO TBEC: 325.0000 mg | DELAYED_RELEASE_TABLET | Freq: Two times a day (BID) | ORAL | Status: AC

## 2015-02-23 MED ORDER — PROMETHAZINE HCL 25 MG/ML IJ SOLN
6.2500 mg | Freq: Four times a day (QID) | INTRAMUSCULAR | Status: DC | PRN
  Administered 2015-02-23: 6.25 mg via INTRAVENOUS
  Filled 2015-02-23: qty 1

## 2015-02-23 MED ORDER — FERROUS SULFATE 325 (65 FE) MG PO TABS: 325.0000 mg | ORAL_TABLET | Freq: Three times a day (TID) | ORAL | Status: AC

## 2015-02-23 MED ORDER — POLYETHYLENE GLYCOL 3350 17 G PO PACK: 17.0000 g | PACK | Freq: Two times a day (BID) | ORAL | Status: DC

## 2015-02-23 MED ORDER — DOCUSATE SODIUM 100 MG PO CAPS: 100.0000 mg | ORAL_CAPSULE | Freq: Two times a day (BID) | ORAL | Status: AC

## 2015-02-23 MED ORDER — PROMETHAZINE HCL 12.5 MG PO TABS: 6.2500 mg | ORAL_TABLET | Freq: Four times a day (QID) | ORAL | Status: AC | PRN

## 2015-02-23 MED ORDER — TIZANIDINE HCL 4 MG PO TABS
4.0000 mg | ORAL_TABLET | Freq: Four times a day (QID) | ORAL | Status: AC | PRN
Start: 2015-02-23 — End: ?

## 2015-02-23 MED ORDER — HYDROCODONE-ACETAMINOPHEN 7.5-325 MG PO TABS: 1.0000 | ORAL_TABLET | ORAL | Status: AC | PRN

## 2015-02-23 NOTE — Progress Notes (Signed)
OT Cancellation Note  Patient Details Name: Madeline Gray MRN: [id number] DOB: [birth year]   Cancelled Treatment:    Reason Eval/Treat Not Completed: Other (comment) (Pt with nausea. ) Pt reporting nausea and requests OT reattempt. OT to reattempt as schedule permits.   Pilar GrammesMathews, [name] 02/23/2015, 12:11 PM

## 2015-02-23 NOTE — Progress Notes (Signed)
Occupational Therapy Evaluation Patient Details Name: Madeline Gray MRN: [id number] DOB: 02-20-1946 Today's Date: 02/23/2015    History of Present Illness L THR   Clinical Impression   Pt admitted with the above diagnoses and presents with below problem list. Pt will benefit from continued acute OT to address the below listed deficits and maximize independence with BADLs prior to d/c to venue below. PTA pt was independent with ADLs. Pt is currently min guard with LB ADLs, min A with shower transfers. Of note, pt continuing to experience nausea and c/o feeling "like I'm shaking on the inside." Pt expressed apprehension about d/c today as she has an hour long car ride home.  Nursing notified. OT to continue to follow acutely.      Follow Up Recommendations  Supervision - Intermittent;Other (comment);No OT follow up (OOB/mobility)    Equipment Recommendations  3 in 1 bedside comode;Other (comment) (already delivered to room)    Recommendations for Other Services       Precautions / Restrictions Precautions Precautions: Fall Restrictions Weight Bearing Restrictions: No Other Position/Activity Restrictions: WBAT      Mobility Bed Mobility Overal bed mobility: Needs Assistance Bed Mobility: Supine to Sit;Sit to Supine     Supine to sit: Supervision Sit to supine: Supervision   General bed mobility comments: Pt self assisting L LE with R LE  Transfers Overall transfer level: Needs assistance Equipment used: Rolling walker (2 wheeled) Transfers: Sit to/from Stand Sit to Stand: Min guard;Supervision         General transfer comment: cues for technique    Balance Overall balance assessment: Needs assistance Sitting-balance support: Feet supported Sitting balance-Leahy Scale: Good     Standing balance support: Bilateral upper extremity supported;During functional activity Standing balance-Leahy Scale: Poor Standing balance comment: rw for balance                             ADL Overall ADL's : Needs assistance/impaired Eating/Feeding: Set up;Sitting   Grooming: Min guard;Standing   Upper Body Bathing: Set up;Sitting   Lower Body Bathing: Min guard;Sit to/from stand;With adaptive equipment   Upper Body Dressing : Set up;Sitting   Lower Body Dressing: Min guard;With adaptive equipment;Sit to/from stand   Toilet Transfer: Min guard;Ambulation;BSC;RW   Toileting- Clothing Manipulation and Hygiene: Min guard;Sitting/lateral lean;Sit to/from stand   Tub/ Shower Transfer: Tub transfer;Minimal assistance;Ambulation;Shower Dealer Details (indicate cue type and reason): Pt verbalized home set up and transfer technique used after last hip surgery. Discused having spouse assist with transfer. Functional mobility during ADLs: Min guard;Rolling walker General ADL Comments: Pt completed bed mobility, toilet transfer, and pericare as detailed above. Nauseous throughout session, ADL education provided inclusing techniques, AE/DME, and home setup. Spouse present.      Vision     Perception     Praxis      Pertinent Vitals/Pain Pain Assessment: 0-10 Pain Score: 5  Pain Location: L hip Pain Descriptors / Indicators: Aching;Sore Pain Intervention(s): Limited activity within patient's tolerance;Monitored during session;Repositioned;Ice applied     Hand Dominance     Extremity/Trunk Assessment Upper Extremity Assessment Upper Extremity Assessment: Overall WFL for tasks assessed   Lower Extremity Assessment Lower Extremity Assessment: Defer to PT evaluation LLE Deficits / Details:    Cervical / Trunk Assessment Cervical / Trunk Assessment: Normal   Communication Communication Communication: No difficulties   Cognition Arousal/Alertness: Awake/alert Behavior During Therapy: WFL for tasks assessed/performed Overall Cognitive Status:  Within Functional Limits for tasks assessed                      General Comments          Shoulder Instructions      Home Living Family/patient expects to be discharged to:: Private residence Living Arrangements: Spouse/significant other Available Help at Discharge: Family Type of Home: House Home Access: Stairs to enter Secretary/administratorntrance Stairs-Number of Steps: 1 Entrance Stairs-Rails: None Home Layout: Two level Alternate Level Stairs-Number of Steps: 3 Alternate Level Stairs-Rails: None Bathroom Shower/Tub: Other (comment) (clawfoot tub)         Home Equipment: Adaptive equipment;Shower Engineering geologistseat Adaptive Equipment: Reacher        Prior Functioning/Environment Level of Independence: Independent             OT Diagnosis: Acute pain   OT Problem List: Impaired balance (sitting and/or standing);Decreased knowledge of use of DME or AE;Decreased knowledge of precautions;Pain   OT Treatment/Interventions: Self-care/ADL training;Therapeutic exercise;DME and/or AE instruction;Therapeutic activities;Patient/family education;Balance training    OT Goals(Current goals can be found in the care plan section) Acute Rehab OT Goals Patient Stated Goal: Resume previous lifestyle with decreased pain OT Goal Formulation: With patient Time For Goal Achievement: 03/02/15 Potential to Achieve Goals: Good ADL Goals Pt Will Perform Grooming: with modified independence;standing Pt Will Perform Lower Body Bathing: with modified independence;with adaptive equipment;sit to/from stand Pt Will Perform Lower Body Dressing: with modified independence;with adaptive equipment;sit to/from stand Pt Will Perform Tub/Shower Transfer: Tub transfer;ambulating;shower seat;3 in 1;rolling walker  OT Frequency: Min 2X/week   Barriers to D/C:            Co-evaluation              End of Session Equipment Utilized During Treatment: Gait belt;Rolling walker Nurse Communication: Other (comment) (see general comments)  Activity Tolerance: Other (comment)  (nausea) Patient left: in bed;with call bell/phone within reach;with family/visitor present   Time: 1610-96041320-1337 OT Time Calculation (min): 17 min Charges:  OT General Charges $OT Visit: 1 Procedure OT Evaluation $Initial OT Evaluation Tier I: 1 Procedure G-Codes:    Pilar GrammesMathews, Andrews Tener H 02/23/2015, 1:51 PM

## 2015-02-23 NOTE — Progress Notes (Signed)
     Subjective: 1 Day Post-Op Procedure(s) (LRB): TOTAL LEFT HIP ARTHROPLASTY ANTERIOR APPROACH (Left)   Patient reports pain as mild, pain controlled. No events throughout the night. Ready to be discharged home if she does well with PT.  Objective:   VITALS:   Filed Vitals:   02/23/15 0135 02/23/15 0507  BP: 110/54 116/52  Pulse: 58 63  Temp: 98.3 F (36.8 C) 98.4 F (36.9 C)  Resp: 16 18    Dorsiflexion/Plantar flexion intact Incision: dressing C/D/I No cellulitis present Compartment soft  LABS  Recent Labs  02/23/15 0438  HGB 11.6*  HCT 34.7*  WBC 7.2  PLT 186     Recent Labs  02/23/15 0438  NA 140  K 5.1  BUN 12  CREATININE 0.72  GLUCOSE 126*     Assessment/Plan: 1 Day Post-Op Procedure(s) (LRB): TOTAL LEFT HIP ARTHROPLASTY ANTERIOR APPROACH (Left) Foley cath d/c'ed Advance diet Up with therapy D/C IV fluids Discharge home with home health  [facility] Follow up with OLIN,Ashelyn Mccravy D in 2 weeks.  Contact information:  [facility] [address] [place] [id number] [phone]           Anastasio AuerbachMatthew S. Maggi Hershkowitz   PAC  02/23/2015, 9:49 AM

## 2015-02-23 NOTE — Care Management Note (Signed)
Case Management Note  Patient Details  Name: Madeline Gray MRN: [id number] Date of Birth: Feb 06, 1946  Subjective/Objective:   S/p Left total hip replacement through an anterior approach                  Action/Plan: Discharge planning, spoke with patient and spouse at bedside. [facility]. Will need RW and 3-n-1 at d/c. [facility], [facility]   Expected Discharge Date:                  Expected Discharge Plan:  Home w Home Health Services  In-House Referral:  NA  Discharge planning Services  CM Consult  Post Acute Care Choice:  Home Health, Durable Medical Equipment Choice offered to:  Patient  DME Arranged:  3-N-1, Walker rolling DME Agency:  [facility]  HH Arranged:  PT [facility]  Status of Service:  Completed, signed off  Medicare Important Message Given:    Date Medicare IM Given:    Medicare IM give by:    Date Additional Medicare IM Given:    Additional Medicare Important Message give by:     If discussed at Long Length of Stay Meetings, dates discussed:    Additional Comments:  Alexis Goodelleele, Audley Hinojos K, RN 02/23/2015, 10:17 AM

## 2015-02-23 NOTE — Evaluation (Signed)
Physical Therapy Evaluation Patient Details Name: Madeline Gray MRN: [id number] DOB: 1946/03/10 Today's Date: 02/23/2015   History of Present Illness  L THR  Clinical Impression  Pt s/p L THR presents with decreased L LE strength/ROM and post op pain limiting functional mobility.  PT should progress to dc home with family assist and HHPT follow up.    Follow Up Recommendations Home health PT    Equipment Recommendations  Rolling walker with 5" wheels    Recommendations for Other Services OT consult     Precautions / Restrictions Precautions Precautions: Fall Restrictions Weight Bearing Restrictions: No Other Position/Activity Restrictions: WBAT      Mobility  Bed Mobility Overal bed mobility: Needs Assistance Bed Mobility: Supine to Sit;Sit to Supine     Supine to sit: Min assist Sit to supine: Min assist   General bed mobility comments: cues for sequence and use of R LE to self assist  Transfers                 General transfer comment: Pt did not progress past sitting EOB 2* increasing nausea  Ambulation/Gait                Stairs            Wheelchair Mobility    Modified Rankin (Stroke Patients Only)       Balance                                             Pertinent Vitals/Pain Pain Assessment: 0-10 Pain Score: 4  Pain Location: L hip Pain Descriptors / Indicators: Aching;Sore Pain Intervention(s): Limited activity within patient's tolerance;Monitored during session;Premedicated before session;Ice applied    Home Living Family/patient expects to be discharged to:: Private residence Living Arrangements: Spouse/significant other Available Help at Discharge: Family Type of Home: House Home Access: Stairs to enter Entrance Stairs-Rails: None Entrance Stairs-Number of Steps: 1 Home Layout: Two level Home Equipment: None      Prior Function Level of Independence: Independent               Hand  Dominance        Extremity/Trunk Assessment   Upper Extremity Assessment: Overall WFL for tasks assessed           Lower Extremity Assessment: LLE deficits/detail   LLE Deficits / Details: Strength at hip 2+/5 with AAROM at hip to 95 flex and 20 abd  Cervical / Trunk Assessment: Normal  Communication   Communication: No difficulties  Cognition Arousal/Alertness: Awake/alert Behavior During Therapy: WFL for tasks assessed/performed Overall Cognitive Status: Within Functional Limits for tasks assessed                      General Comments      Exercises Total Joint Exercises Ankle Circles/Pumps: AROM;15 reps;Supine Quad Sets: AROM;Both;10 reps;Supine Heel Slides: AAROM;20 reps;Left;Supine Hip ABduction/ADduction: AAROM;Left;15 reps;Supine      Assessment/Plan    PT Assessment Patient needs continued PT services  PT Diagnosis Difficulty walking   PT Problem List Decreased strength;Decreased range of motion;Decreased activity tolerance;Decreased mobility;Decreased knowledge of use of DME;Pain  PT Treatment Interventions DME instruction;Gait training;Stair training;Functional mobility training;Therapeutic activities;Therapeutic exercise;Patient/family education   PT Goals (Current goals can be found in the Care Plan section) Acute Rehab PT Goals Patient Stated Goal: Resume previous lifestyle with decreased pain PT  Goal Formulation: With patient Time For Goal Achievement: 02/24/15 Potential to Achieve Goals: Good    Frequency 7X/week   Barriers to discharge        Co-evaluation               End of Session Equipment Utilized During Treatment: Gait belt Activity Tolerance: Other (comment) (nausea) Patient left: in bed;with call bell/phone within reach;with nursing/sitter in room Nurse Communication: Mobility status         Time: 0820-0848 PT Time Calculation (min) (ACUTE ONLY): 28 min   Charges:   PT Evaluation $Initial PT Evaluation Tier  I: 1 Procedure PT Treatments $Therapeutic Exercise: 8-22 mins   PT G Codes:        Deretha Ertle Mar 02, 2015, 11:52 AM

## 2015-02-23 NOTE — Progress Notes (Signed)
Physical Therapy Treatment Patient Details Name: Madeline Gray MRN: [id number] DOB: 04-Jun-1945 Today's Date: 02/23/2015    History of Present Illness L THR    PT Comments    Pt progressing well with mobility despite intermittent nausea.  Reviewed home therex, stairs and car transfers with pt and spouse.  Follow Up Recommendations  Home health PT     Equipment Recommendations  Rolling walker with 5" wheels    Recommendations for Other Services OT consult     Precautions / Restrictions Precautions Precautions: Fall Restrictions Weight Bearing Restrictions: No Other Position/Activity Restrictions: WBAT    Mobility  Bed Mobility Overal bed mobility: Needs Assistance Bed Mobility: Supine to Sit;Sit to Supine     Supine to sit: Supervision Sit to supine: Supervision   General bed mobility comments: Pt self assisting L LE with R LE  Transfers Overall transfer level: Needs assistance Equipment used: Rolling walker (2 wheeled) Transfers: Sit to/from Stand Sit to Stand: Min guard;Supervision         General transfer comment: cues for LE managment and use of UEs to self assist  Ambulation/Gait Ambulation/Gait assistance: Min guard;Supervision Ambulation Distance (Feet): 200 Feet Assistive device: Rolling walker (2 wheeled) Gait Pattern/deviations: Step-to pattern;Step-through pattern;Decreased step length - right;Decreased step length - left;Shuffle;Trunk flexed     General Gait Details: cues for posture, position from RW and initial sequence.  Pt progressed to recip gait`   Stairs Stairs: Yes Stairs assistance: Min assist Stair Management: No rails;Step to pattern;Backwards;Forwards;With walker Number of Stairs: 7 General stair comments: single step twice fwd adn twice bkwd.  2 steps twice bkwd with RW.  Cues for sequence and foot placement.  Spouse participating and written instructions provided  Wheelchair Mobility    Modified Rankin (Stroke Patients  Only)       Balance                                    Cognition Arousal/Alertness: Awake/alert Behavior During Therapy: WFL for tasks assessed/performed Overall Cognitive Status: Within Functional Limits for tasks assessed                      Exercises Total Joint Exercises Ankle Circles/Pumps: AROM;15 reps;Supine Quad Sets: AROM;Both;10 reps;Supine Heel Slides: AAROM;20 reps;Left;Supine Hip ABduction/ADduction: AAROM;Left;15 reps;Supine    General Comments        Pertinent Vitals/Pain Pain Assessment: 0-10 Pain Score: 3  Pain Location: L hip Pain Descriptors / Indicators: Aching;Sore Pain Intervention(s): Premedicated before session;Limited activity within patient's tolerance;Monitored during session;Ice applied    Home Living Family/patient expects to be discharged to:: Private residence Living Arrangements: Spouse/significant other Available Help at Discharge: Family Type of Home: House Home Access: Stairs to enter Entrance Stairs-Rails: None Home Layout: Two level Home Equipment: None      Prior Function Level of Independence: Independent          PT Goals (current goals can now be found in the care plan section) Acute Rehab PT Goals Patient Stated Goal: Resume previous lifestyle with decreased pain PT Goal Formulation: With patient Time For Goal Achievement: 02/24/15 Potential to Achieve Goals: Good Progress towards PT goals: Progressing toward goals    Frequency  7X/week    PT Plan Current plan remains appropriate    Co-evaluation             End of Session Equipment Utilized During Treatment: Gait belt Activity  Tolerance: Patient tolerated treatment well;Other (comment) (until onset nausea) Patient left: in bed;with call bell/phone within reach;with family/visitor present     Time: 1112-1140 PT Time Calculation (min) (ACUTE ONLY): 28 min  Charges:  $Gait Training: 8-22 mins $Therapeutic Exercise: 8-22  mins $Therapeutic Activity: 8-22 mins                    G Codes:      Jex Strausbaugh 03/21/15, 12:00 PM

## 2015-02-24 LAB — BASIC METABOLIC PANEL
ANION GAP: 8 (ref 5–15)
BUN: 12 mg/dL (ref 6–20)
CO2: 23 mmol/L (ref 22–32)
Calcium: 8.8 mg/dL — ABNORMAL LOW (ref 8.9–10.3)
Chloride: 108 mmol/L (ref 101–111)
Creatinine, Ser: 0.64 mg/dL (ref 0.44–1.00)
GFR calc Af Amer: >60 mL/min (ref >60)
GLUCOSE: 105 mg/dL — AB (ref 65–99)
POTASSIUM: 3.8 mmol/L (ref 3.5–5.1)
Sodium: 139 mmol/L (ref 135–145)

## 2015-02-24 LAB — CBC
HCT: 34.3 % — ABNORMAL LOW (ref 36.0–46.0)
Hemoglobin: 11.3 g/dL — ABNORMAL LOW (ref 12.0–15.0)
MCH: 30.3 pg (ref 26.0–34.0)
MCHC: 32.9 g/dL (ref 30.0–36.0)
MCV: 92 fL (ref 78.0–100.0)
PLATELETS: 176 10*3/uL (ref 150–400)
RBC: 3.73 MIL/uL — AB (ref 3.87–5.11)
RDW: 13.5 % (ref 11.5–15.5)
WBC: 8.4 10*3/uL (ref 4.0–10.5)

## 2015-02-24 MED ORDER — ONDANSETRON HCL 4 MG PO TABS: 4.0000 mg | ORAL_TABLET | Freq: Four times a day (QID) | ORAL | Status: DC | PRN

## 2015-02-24 MED ORDER — ONDANSETRON HCL 4 MG PO TABS: 4.0000 mg | ORAL_TABLET | Freq: Four times a day (QID) | ORAL | Status: AC | PRN

## 2015-02-24 NOTE — Progress Notes (Signed)
Patient ID: [name], female   DOB: [birth year], 69 y.o.   MRN: [id number] Subjective: 2 Days Post-Op Procedure(s) (LRB): TOTAL LEFT HIP ARTHROPLASTY ANTERIOR APPROACH (Left)    Patient reports pain as mild.  Nausea improved with Zofran she thinks, though yesterday it may not have been as effective  Objective:   VITALS:   Filed Vitals:   02/23/15 2120 02/24/15 0437  BP: 120/62 122/56  Pulse: 68 66  Temp: 98.6 F (37 C) 98.4 F (36.9 C)  Resp: 16 16    Neurovascular intact Incision: dressing C/D/I  LABS  Recent Labs  02/23/15 0438 02/24/15 0448  HGB 11.6* 11.3*  HCT 34.7* 34.3*  WBC 7.2 8.4  PLT 186 176     Recent Labs  02/23/15 0438 02/24/15 0448  NA 140 139  K 5.1 3.8  BUN 12 12  CREATININE 0.72 0.64  GLUCOSE 126* 105*    No results for input(s): LABPT, INR in the last 72 hours.   Assessment/Plan: 2 Days Post-Op Procedure(s) (LRB): TOTAL LEFT HIP ARTHROPLASTY ANTERIOR APPROACH (Left)   Up with therapy Discharge home today Rx on chart RTC in 2 weks

## 2015-02-24 NOTE — Progress Notes (Signed)
Occupational Therapy Treatment and Discharge Patient Details Name: Madeline Gray MRN: [id number] DOB: [birth year] Today's Date: 02/24/2015    History of present illness L THR   OT comments  This 69 yo female admitted and underwent above presents to acute OT with all education completed with her and husband and pt to D/C today. Acute OT will sign off.  Follow Up Recommendations  No OT follow up;Supervision - Intermittent          Precautions / Restrictions Precautions Precautions: Fall Restrictions Weight Bearing Restrictions: No Other Position/Activity Restrictions: WBAT       Mobility Bed Mobility               General bed mobility comments: Pt up in recliner upon arrival  Transfers Overall transfer level: Modified independent Equipment used: Rolling walker (2 wheeled) Transfers: Sit to/from Stand                    ADL Overall ADL's : Modified independent                                       General ADL Comments: We discussed how she does her transfer at home to her claw foot tub going from as seat outside the tub to a seat inside the tub and it appears safe to do (especially since she does not have hip precautions and her husband to A her)                Cognition   Behavior During Therapy: WFL for tasks assessed/performed Overall Cognitive Status: Within Functional Limits for tasks assessed                                    Pertinent Vitals/ Pain       Pain Assessment: 0-10 Pain Score: 2  Pain Location: left hip Pain Descriptors / Indicators: Sore Pain Intervention(s): Repositioned            Progress Toward Goals  OT Goals(current goals can now be found in the care plan section)  Progress towards OT goals: Goals met/education completed, patient discharged from [place] Discharge plan remains appropriate       End of Session Equipment Utilized During Treatment: Rolling walker   Activity  Tolerance Patient tolerated treatment well   Patient Left in chair;with call bell/phone within reach;with family/visitor present   Nurse Communication          Time: 0865-7846 OT Time Calculation (min): 14 min  Charges: OT General Charges $OT Visit: 1 Procedure OT Treatments $Self Care/Home Management : 8-22 mins  Almon Register [phone] 02/24/2015, 9:40 AM

## 2015-02-24 NOTE — Progress Notes (Signed)
Physical Therapy Treatment Patient Details Name: Madeline Gray MRN: [id number] DOB: 1945-04-10 Today's Date: 02/24/2015    History of Present Illness L THR    PT Comments    Pt progressing well with mobility and with no c/o nausea this am.  Pt eager for dc home  Follow Up Recommendations  Home health PT     Equipment Recommendations  Rolling walker with 5" wheels    Recommendations for Other Services OT consult     Precautions / Restrictions Precautions Precautions: Fall Restrictions Weight Bearing Restrictions: No Other Position/Activity Restrictions: WBAT    Mobility  Bed Mobility Overal bed mobility: Needs Assistance Bed Mobility: Sit to Supine       Sit to supine: Supervision   General bed mobility comments: min cues for use of R LE to self assist  Transfers Overall transfer level: Modified independent Equipment used: Rolling walker (2 wheeled) Transfers: Sit to/from Stand Sit to Stand: Modified independent (Device/Increase time)         General transfer comment: Good saftey awareness  Ambulation/Gait Ambulation/Gait assistance: Min guard;Supervision Ambulation Distance (Feet): 222 Feet Assistive device: Rolling walker (2 wheeled) Gait Pattern/deviations: Step-to pattern;Step-through pattern;Decreased step length - left;Shuffle;Trunk flexed     General Gait Details: cues for posture, position from RW and initial sequence.  Pt progressed to recip gait`   Stairs         General stair comments: Pt and spouse state comfortable with stairs  Wheelchair Mobility    Modified Rankin (Stroke Patients Only)       Balance                                    Cognition Arousal/Alertness: Awake/alert Behavior During Therapy: WFL for tasks assessed/performed Overall Cognitive Status: Within Functional Limits for tasks assessed                      Exercises Total Joint Exercises Ankle Circles/Pumps: AROM;15  reps;Supine Quad Sets: AROM;Both;10 reps;Supine Gluteal Sets: AROM;Both;10 reps;Supine Heel Slides: AAROM;20 reps;Left;Supine Hip ABduction/ADduction: AAROM;Left;15 reps;Supine    General Comments        Pertinent Vitals/Pain Pain Assessment: 0-10 Pain Score: 2  Pain Location: L hip Pain Descriptors / Indicators: Aching;Sore Pain Intervention(s): Limited activity within patient's tolerance;Monitored during session;Premedicated before session;Ice applied    Home Living                      Prior Function            PT Goals (current goals can now be found in the care plan section) Acute Rehab PT Goals Patient Stated Goal: Resume previous lifestyle with decreased pain PT Goal Formulation: With patient Time For Goal Achievement: 02/24/15 Potential to Achieve Goals: Good Progress towards PT goals: Progressing toward goals    Frequency  7X/week    PT Plan Current plan remains appropriate    Co-evaluation             End of Session   Activity Tolerance: Patient tolerated treatment well Patient left: in bed;with call bell/phone within reach;with family/visitor present     Time: 1000-1023 PT Time Calculation (min) (ACUTE ONLY): 23 min  Charges:  $Gait Training: 8-22 mins $Therapeutic Exercise: 8-22 mins                    G Codes:  Madeline Gray 02/24/2015, 1:20 PM

## 2015-03-04 NOTE — Discharge Summary (Signed)
Physician Discharge Summary  Patient ID: Madeline Gray MRN: [id number] DOB/AGE: 1946/02/05 69 y.o.  Admit date: 02/22/2015 Discharge date: 02/24/2015   Procedures:  Procedure(s) (LRB): TOTAL LEFT HIP ARTHROPLASTY ANTERIOR APPROACH (Left)  Attending Physician:  Dr. Durene Romans   Admission Diagnoses:   Left hip primary OA / pain  Discharge Diagnoses:  Principal Problem:   S/P left THA, AA  Past Medical History  Diagnosis Date  . Hypothyroidism     no current meds for  . Arthritis     oa    HPI:    Madeline Gray, 69 y.o. female, has a history of pain and functional disability in the left hip(s) due to arthritis and patient has failed non-surgical conservative treatments for greater than 12 weeks to include NSAID's and/or analgesics, corticosteriod injections, supervised PT with diminished ADL's post treatment, use of assistive devices and activity modification. Onset of symptoms was gradual starting 5-6 years ago with gradually worsening course since that time.The patient noted prior procedures of the hip to include arthroscopy on the left hip(s). Patient currently rates pain in the left hip at 9 out of 10 with activity. Patient has night pain, worsening of pain with activity and weight bearing, trendelenberg gait, pain that interfers with activities of daily living and pain with passive range of motion. Patient has evidence of periarticular osteophytes and joint space narrowing by imaging studies. This condition presents safety issues increasing the risk of falls. There is no current active infection. Risks, benefits and expectations were discussed with the patient. Risks including but not limited to the risk of anesthesia, blood clots, nerve damage, blood vessel damage, failure of the prosthesis, infection and up to and including death. Patient understand the risks, benefits and expectations and wishes to proceed with surgery.   PCP: No PCP Per Patient   Discharged  Condition: good  Hospital Course:  Patient underwent the above stated procedure on 02/22/2015. Patient tolerated the procedure well and brought to the recovery room in good condition and subsequently to the floor.  POD #1 BP: 116/52 ; Pulse: 63 ; Temp: 98.4 F (36.9 C) ; Resp: 18 Patient reports pain as mild, pain controlled. No events throughout the night. Dorsiflexion/plantar flexion intact, incision: dressing C/D/I, no cellulitis present and compartment soft.   LABS  Basename    HGB     11.6  HCT     34.7   POD #2  BP: 122/56 ; Pulse: 66 ; Temp: 98.4 F (36.9 C) ; Resp: 16 Patient reports pain as mild. Nausea improved with Zofran she thinks, though yesterday it may not have been as effective.   Ready to be discharged home. Neurovascular intact and incision: dressing C/D/I  LABS  Basename    HGB     11.3  HCT     34.3    Discharge Exam: General appearance: alert, cooperative and no distress Extremities: Homans sign is negative, no sign of DVT, no edema, redness or tenderness in the calves or thighs and no ulcers, gangrene or trophic changes  Disposition: Home with follow up in 2 weeks   Follow-up Information    Follow up with Shelda Pal, MD. Schedule an appointment as soon as possible for a visit in 2 weeks.   Specialty:  Orthopedic Surgery   Contact information:   [address] [place] [address] [phone]       Follow up with [facility].   Why:  physical therapy   Contact information:  [address] [place] [address] [phone]       Follow up with [facility].   Why:  3-n-1   Contact information:   [address] [place] [address] [phone]       Discharge Instructions    Call MD / Call 911    Complete by:  As directed   If you experience chest pain or shortness of breath, CALL 911 and be transported to the hospital emergency room.  If you develope a fever above 101  F, pus (white drainage) or increased drainage or redness at the wound, or calf pain, call your surgeon's office.     Change dressing    Complete by:  As directed   Maintain surgical dressing until follow up in the clinic. If the edges start to pull up, may reinforce with tape. If the dressing is no longer working, may remove and cover with gauze and tape, but must keep the area dry and clean.  Call with any questions or concerns.     Constipation Prevention    Complete by:  As directed   Drink plenty of fluids.  Prune juice may be helpful.  You may use a stool softener, such as Colace (over the counter) 100 mg twice a day.  Use MiraLax (over the counter) for constipation as needed.     Diet - low sodium heart healthy    Complete by:  As directed      Discharge instructions    Complete by:  As directed   Maintain surgical dressing until follow up in the clinic. If the edges start to pull up, may reinforce with tape. If the dressing is no longer working, may remove and cover with gauze and tape, but must keep the area dry and clean.  [facility]. Call with any questions or concerns.     Increase activity slowly as tolerated    Complete by:  As directed   Weight bearing as tolerated with assist device (walker, cane, etc) as directed, use it as long as suggested by your surgeon or therapist, typically at least 4-6 weeks.     TED hose    Complete by:  As directed   Use stockings (TED hose) for 2 weeks on both leg(s).  You may remove them at night for sleeping.             Medication List    STOP taking these medications        acetaminophen 500 MG tablet  Commonly known as:  TYLENOL     HYDROcodone-acetaminophen 5-325 MG tablet  Commonly known as:  NORCO/VICODIN  Replaced by:  HYDROcodone-acetaminophen 7.5-325 MG tablet     MOTRIN PM PO     oxyCODONE-acetaminophen 5-325 MG tablet  Commonly known as:  PERCOCET/ROXICET      TAKE these medications         Arnica Gel  Apply 1 application topically at bedtime.     aspirin 325 MG EC tablet  Take 1 tablet (325 mg total) by mouth 2 (two) times daily.     B-Complex Tabs  Take 1 tablet by mouth 2 (two) times daily.     Biotin 10 MG Caps  Take 10 mg by mouth daily.     docusate sodium 100 MG capsule  Commonly known as:  COLACE  Take 1 capsule (100 mg total) by mouth 2 (two) times daily.  ferrous sulfate 325 (65 FE) MG tablet  Take 1 tablet (325 mg total) by mouth 3 (three) times daily after meals.     gabapentin 300 MG capsule  Commonly known as:  NEURONTIN  Take 300 mg by mouth at bedtime.     HYDROcodone-acetaminophen 7.5-325 MG tablet  Commonly known as:  NORCO  Take 1-2 tablets by mouth every 4 (four) hours as needed for moderate pain.     multivitamin with minerals Tabs tablet  Take 1 tablet by mouth daily.     ondansetron 4 MG tablet  Commonly known as:  ZOFRAN  Take 1 tablet (4 mg total) by mouth every 6 (six) hours as needed for nausea.     OVER THE COUNTER MEDICATION  Take 1 tablet by mouth 3 (three) times daily. Bone Strength multivitamin     polyethylene glycol packet  Commonly known as:  MIRALAX / GLYCOLAX  Take 17 g by mouth 2 (two) times daily.     PROBIOTIC DAILY PO  Take 1 capsule by mouth daily.     promethazine 12.5 MG tablet  Commonly known as:  PHENERGAN  Take 0.5-1 tablets (6.25-12.5 mg total) by mouth every 6 (six) hours as needed for nausea or vomiting.     tiZANidine 4 MG tablet  Commonly known as:  ZANAFLEX  Take 1 tablet (4 mg total) by mouth every 6 (six) hours as needed for muscle spasms.         Signed: Anastasio Auerbach. Kayvion Arneson   PA-C  03/04/2015, 7:48 AM

## 2015-05-31 ENCOUNTER — Other Ambulatory Visit: Payer: Self-pay | Admitting: Orthopedic Surgery

## 2015-05-31 DIAGNOSIS — M545 Low back pain, unspecified: Secondary | ICD-10-CM

## 2015-05-31 DIAGNOSIS — G8929 Other chronic pain: Secondary | ICD-10-CM

## 2015-06-09 ENCOUNTER — Ambulatory Visit
Admission: RE | Admit: 2015-06-09 | Discharge: 2015-06-09 | Disposition: A | Discharge status: Home / Self Care | Payer: Medicare Other | Source: Ambulatory Visit | Attending: Orthopedic Surgery | Admitting: Orthopedic Surgery

## 2015-06-09 ENCOUNTER — Other Ambulatory Visit: Payer: Self-pay

## 2015-06-09 DIAGNOSIS — G8929 Other chronic pain: Secondary | ICD-10-CM

## 2015-06-09 DIAGNOSIS — M545 Low back pain: Principal | ICD-10-CM

## 2015-06-09 MED ORDER — IOHEXOL 180 MG/ML  SOLN
15.0000 mL | Freq: Once | INTRAMUSCULAR | Status: AC | PRN
  Administered 2015-06-09: 15 mL via INTRATHECAL

## 2015-06-09 MED ORDER — HYDROXYZINE HCL 25 MG PO TABS: 25.0000 mg | ORAL_TABLET | Freq: Once | ORAL | Status: DC

## 2015-06-09 MED ORDER — DIAZEPAM 5 MG PO TABS
10.0000 mg | ORAL_TABLET | Freq: Once | ORAL | Status: AC
  Administered 2015-06-09: 10 mg via ORAL

## 2015-06-09 NOTE — Discharge Instructions (Signed)
Myelogram Discharge Instructions  1. Go home and rest quietly for the next 24 hours.  It is important to lie flat for the next 24 hours.  Get up only to go to the restroom.  You may lie in the bed or on a couch on your back, your stomach, your left side or your right side.  You may have one pillow under your head.  You may have pillows between your knees while you are on your side or under your knees while you are on your back.  2. DO NOT drive today.  Recline the seat as far back as it will go, while still wearing your seat belt, on the way home.  3. You may get up to go to the bathroom as needed.  You may sit up for 10 minutes to eat.  You may resume your normal diet and medications unless otherwise indicated.  Drink lots of extra fluids today and tomorrow.  4. The incidence of headache, nausea, or vomiting is about 5% (one in 20 patients).  If you develop a headache, lie flat and drink plenty of fluids until the headache goes away.  Caffeinated beverages may be helpful.  If you develop severe nausea and vomiting or a headache that does not go away with flat bed rest, call [phone].  5. You may resume normal activities after your 24 hours of bed rest is over; however, do not exert yourself strongly or do any heavy lifting tomorrow. If when you get up you have a headache when standing, go back to bed and force fluids for another 24 hours.  6. Call your physician for a follow-up appointment.  The results of your myelogram will be sent directly to your physician by the following day.  7. If you have any questions or if complications develop after you arrive home, please call [phone].  Discharge instructions have been explained to the patient.  The patient, or the person responsible for the patient, fully understands these instructions.       May resume Phenergan and Tramadol on June 10, 2015, after 9:30 am.

## 2015-06-09 NOTE — Progress Notes (Addendum)
Zantac  150 mg at 10: 40 am,given for upset stomach, [facility]

## 2015-06-09 NOTE — Progress Notes (Signed)
Pt states she has been off Tramadol and Phenergan for the past 2 days.  Discharge instructions explained to pt.

## 2015-07-15 ENCOUNTER — Other Ambulatory Visit: Payer: Self-pay | Admitting: Orthopedic Surgery

## 2015-07-15 DIAGNOSIS — Z96642 Presence of left artificial hip joint: Secondary | ICD-10-CM

## 2015-07-22 ENCOUNTER — Other Ambulatory Visit: Payer: Self-pay | Admitting: Orthopedic Surgery

## 2015-07-22 ENCOUNTER — Ambulatory Visit
Admission: RE | Admit: 2015-07-22 | Discharge: 2015-07-22 | Disposition: A | Discharge status: Home / Self Care | Payer: Medicare Other | Source: Ambulatory Visit | Attending: Orthopedic Surgery | Admitting: Orthopedic Surgery

## 2015-07-22 DIAGNOSIS — Z96642 Presence of left artificial hip joint: Secondary | ICD-10-CM

## 2015-07-22 MED ORDER — IOHEXOL 180 MG/ML  SOLN
1.0000 mL | Freq: Once | INTRAMUSCULAR | Status: AC | PRN
  Administered 2015-07-22: 1 mL via EPIDURAL

## 2015-07-22 MED ORDER — METHYLPREDNISOLONE ACETATE 40 MG/ML INJ SUSP (RADIOLOG
120.0000 mg | Freq: Once | INTRAMUSCULAR | Status: AC
  Administered 2015-07-22: 120 mg via EPIDURAL

## 2015-07-22 NOTE — Discharge Instructions (Signed)

## 2015-08-30 ENCOUNTER — Other Ambulatory Visit: Payer: Self-pay | Admitting: Family Medicine

## 2015-08-30 DIAGNOSIS — R945 Abnormal results of liver function studies: Principal | ICD-10-CM

## 2015-08-30 DIAGNOSIS — R7989 Other specified abnormal findings of blood chemistry: Secondary | ICD-10-CM

## 2015-09-06 ENCOUNTER — Ambulatory Visit
Admission: RE | Admit: 2015-09-06 | Discharge: 2015-09-06 | Disposition: A | Discharge status: Home / Self Care | Payer: Medicare Other | Source: Ambulatory Visit | Attending: Family Medicine | Admitting: Family Medicine

## 2015-09-06 DIAGNOSIS — R7989 Other specified abnormal findings of blood chemistry: Secondary | ICD-10-CM

## 2015-09-06 DIAGNOSIS — R945 Abnormal results of liver function studies: Principal | ICD-10-CM

## 2015-09-26 ENCOUNTER — Encounter: Payer: Self-pay | Admitting: Family Medicine

## 2015-11-07 DIAGNOSIS — K449 Diaphragmatic hernia without obstruction or gangrene: Secondary | ICD-10-CM | POA: Diagnosis not present

## 2015-11-07 DIAGNOSIS — R112 Nausea with vomiting, unspecified: Secondary | ICD-10-CM | POA: Diagnosis not present

## 2015-11-07 DIAGNOSIS — K2950 Unspecified chronic gastritis without bleeding: Secondary | ICD-10-CM | POA: Diagnosis not present

## 2015-11-07 DIAGNOSIS — K259 Gastric ulcer, unspecified as acute or chronic, without hemorrhage or perforation: Secondary | ICD-10-CM | POA: Diagnosis not present

## 2015-11-07 DIAGNOSIS — R1013 Epigastric pain: Secondary | ICD-10-CM | POA: Diagnosis not present

## 2015-11-07 DIAGNOSIS — Z13810 Encounter for screening for upper gastrointestinal disorder: Secondary | ICD-10-CM | POA: Diagnosis not present

## 2015-11-30 DIAGNOSIS — K259 Gastric ulcer, unspecified as acute or chronic, without hemorrhage or perforation: Secondary | ICD-10-CM | POA: Diagnosis not present

## 2015-11-30 DIAGNOSIS — K5900 Constipation, unspecified: Secondary | ICD-10-CM | POA: Diagnosis not present

## 2016-01-11 DIAGNOSIS — R110 Nausea: Secondary | ICD-10-CM | POA: Diagnosis not present

## 2016-01-11 DIAGNOSIS — R1013 Epigastric pain: Secondary | ICD-10-CM | POA: Diagnosis not present

## 2016-01-17 DIAGNOSIS — Z09 Encounter for follow-up examination after completed treatment for conditions other than malignant neoplasm: Secondary | ICD-10-CM | POA: Diagnosis not present

## 2016-01-17 DIAGNOSIS — K259 Gastric ulcer, unspecified as acute or chronic, without hemorrhage or perforation: Secondary | ICD-10-CM | POA: Diagnosis not present

## 2016-01-17 DIAGNOSIS — R1013 Epigastric pain: Secondary | ICD-10-CM | POA: Diagnosis not present

## 2016-01-17 DIAGNOSIS — Z8711 Personal history of peptic ulcer disease: Secondary | ICD-10-CM | POA: Diagnosis not present

## 2016-01-17 DIAGNOSIS — R110 Nausea: Secondary | ICD-10-CM | POA: Diagnosis not present

## 2016-01-17 DIAGNOSIS — R109 Unspecified abdominal pain: Secondary | ICD-10-CM | POA: Diagnosis not present

## 2016-03-13 DIAGNOSIS — G894 Chronic pain syndrome: Secondary | ICD-10-CM | POA: Diagnosis not present

## 2016-03-13 DIAGNOSIS — M47817 Spondylosis without myelopathy or radiculopathy, lumbosacral region: Secondary | ICD-10-CM | POA: Diagnosis not present

## 2016-03-13 DIAGNOSIS — M961 Postlaminectomy syndrome, not elsewhere classified: Secondary | ICD-10-CM | POA: Diagnosis not present

## 2016-03-13 DIAGNOSIS — M4608 Spinal enthesopathy, sacral and sacrococcygeal region: Secondary | ICD-10-CM | POA: Diagnosis not present

## 2016-03-28 DIAGNOSIS — Z96642 Presence of left artificial hip joint: Secondary | ICD-10-CM | POA: Diagnosis not present

## 2016-03-28 DIAGNOSIS — Z471 Aftercare following joint replacement surgery: Secondary | ICD-10-CM | POA: Diagnosis not present

## 2016-04-03 ENCOUNTER — Other Ambulatory Visit: Payer: Self-pay | Admitting: Family Medicine

## 2016-04-03 DIAGNOSIS — Z9882 Breast implant status: Secondary | ICD-10-CM

## 2016-04-03 DIAGNOSIS — Z0000 Encounter for general adult medical examination without abnormal findings: Secondary | ICD-10-CM | POA: Diagnosis not present

## 2016-04-03 DIAGNOSIS — Z136 Encounter for screening for cardiovascular disorders: Secondary | ICD-10-CM | POA: Diagnosis not present

## 2016-04-03 DIAGNOSIS — Z1211 Encounter for screening for malignant neoplasm of colon: Secondary | ICD-10-CM | POA: Diagnosis not present

## 2016-04-03 DIAGNOSIS — Z1231 Encounter for screening mammogram for malignant neoplasm of breast: Secondary | ICD-10-CM

## 2016-04-03 DIAGNOSIS — M810 Age-related osteoporosis without current pathological fracture: Secondary | ICD-10-CM

## 2016-04-30 DIAGNOSIS — M961 Postlaminectomy syndrome, not elsewhere classified: Secondary | ICD-10-CM | POA: Diagnosis not present

## 2016-04-30 DIAGNOSIS — M47817 Spondylosis without myelopathy or radiculopathy, lumbosacral region: Secondary | ICD-10-CM | POA: Diagnosis not present

## 2016-04-30 DIAGNOSIS — G894 Chronic pain syndrome: Secondary | ICD-10-CM | POA: Diagnosis not present

## 2016-04-30 DIAGNOSIS — M4608 Spinal enthesopathy, sacral and sacrococcygeal region: Secondary | ICD-10-CM | POA: Diagnosis not present

## 2016-05-08 ENCOUNTER — Ambulatory Visit
Admission: RE | Admit: 2016-05-08 | Discharge: 2016-05-08 | Disposition: A | Discharge status: Home / Self Care | Payer: Medicare Other | Source: Ambulatory Visit | Attending: Family Medicine | Admitting: Family Medicine

## 2016-05-08 DIAGNOSIS — M810 Age-related osteoporosis without current pathological fracture: Secondary | ICD-10-CM | POA: Diagnosis not present

## 2016-05-08 DIAGNOSIS — Z1231 Encounter for screening mammogram for malignant neoplasm of breast: Secondary | ICD-10-CM

## 2016-05-08 DIAGNOSIS — Z9882 Breast implant status: Secondary | ICD-10-CM

## 2016-05-08 DIAGNOSIS — Z780 Asymptomatic menopausal state: Secondary | ICD-10-CM | POA: Diagnosis not present

## 2016-05-14 DIAGNOSIS — R110 Nausea: Secondary | ICD-10-CM | POA: Diagnosis not present

## 2016-06-18 DIAGNOSIS — M961 Postlaminectomy syndrome, not elsewhere classified: Secondary | ICD-10-CM | POA: Diagnosis not present

## 2016-06-18 DIAGNOSIS — M4608 Spinal enthesopathy, sacral and sacrococcygeal region: Secondary | ICD-10-CM | POA: Diagnosis not present

## 2016-06-18 DIAGNOSIS — M47817 Spondylosis without myelopathy or radiculopathy, lumbosacral region: Secondary | ICD-10-CM | POA: Diagnosis not present

## 2016-06-18 DIAGNOSIS — G894 Chronic pain syndrome: Secondary | ICD-10-CM | POA: Diagnosis not present

## 2016-07-12 DIAGNOSIS — M810 Age-related osteoporosis without current pathological fracture: Secondary | ICD-10-CM | POA: Diagnosis not present

## 2016-07-16 DIAGNOSIS — G894 Chronic pain syndrome: Secondary | ICD-10-CM | POA: Diagnosis not present

## 2016-07-16 DIAGNOSIS — M4608 Spinal enthesopathy, sacral and sacrococcygeal region: Secondary | ICD-10-CM | POA: Diagnosis not present

## 2016-07-16 DIAGNOSIS — M47817 Spondylosis without myelopathy or radiculopathy, lumbosacral region: Secondary | ICD-10-CM | POA: Diagnosis not present

## 2016-07-16 DIAGNOSIS — M961 Postlaminectomy syndrome, not elsewhere classified: Secondary | ICD-10-CM | POA: Diagnosis not present

## 2016-07-18 DIAGNOSIS — G8929 Other chronic pain: Secondary | ICD-10-CM | POA: Diagnosis not present

## 2016-07-18 DIAGNOSIS — M810 Age-related osteoporosis without current pathological fracture: Secondary | ICD-10-CM | POA: Diagnosis not present

## 2016-07-18 DIAGNOSIS — E559 Vitamin D deficiency, unspecified: Secondary | ICD-10-CM | POA: Diagnosis not present

## 2016-07-18 DIAGNOSIS — Z6821 Body mass index (BMI) 21.0-21.9, adult: Secondary | ICD-10-CM | POA: Diagnosis not present

## 2016-08-09 DIAGNOSIS — M4608 Spinal enthesopathy, sacral and sacrococcygeal region: Secondary | ICD-10-CM | POA: Diagnosis not present

## 2016-08-09 DIAGNOSIS — M961 Postlaminectomy syndrome, not elsewhere classified: Secondary | ICD-10-CM | POA: Diagnosis not present

## 2016-08-09 DIAGNOSIS — M47817 Spondylosis without myelopathy or radiculopathy, lumbosacral region: Secondary | ICD-10-CM | POA: Diagnosis not present

## 2016-08-09 DIAGNOSIS — G894 Chronic pain syndrome: Secondary | ICD-10-CM | POA: Diagnosis not present

## 2016-08-22 DIAGNOSIS — M4608 Spinal enthesopathy, sacral and sacrococcygeal region: Secondary | ICD-10-CM | POA: Diagnosis not present

## 2016-08-22 DIAGNOSIS — M47817 Spondylosis without myelopathy or radiculopathy, lumbosacral region: Secondary | ICD-10-CM | POA: Diagnosis not present

## 2016-08-22 DIAGNOSIS — G894 Chronic pain syndrome: Secondary | ICD-10-CM | POA: Diagnosis not present

## 2016-08-22 DIAGNOSIS — M961 Postlaminectomy syndrome, not elsewhere classified: Secondary | ICD-10-CM | POA: Diagnosis not present

## 2016-08-29 ENCOUNTER — Ambulatory Visit (INDEPENDENT_AMBULATORY_CARE_PROVIDER_SITE_OTHER): Payer: Self-pay | Admitting: Orthopaedic Surgery

## 2016-10-15 IMAGING — CR DG HIP (WITH OR WITHOUT PELVIS) 1V PORT*L*
1 series · 2 of 2 positions shown · non-contrast
Comparison: None.

CLINICAL DATA: Postop

EXAM:
DG HIP (WITH OR WITHOUT PELVIS) 1V PORT LEFT

[Series 1: AP · U · 2 of 2 slices shown]
[im 1/2]
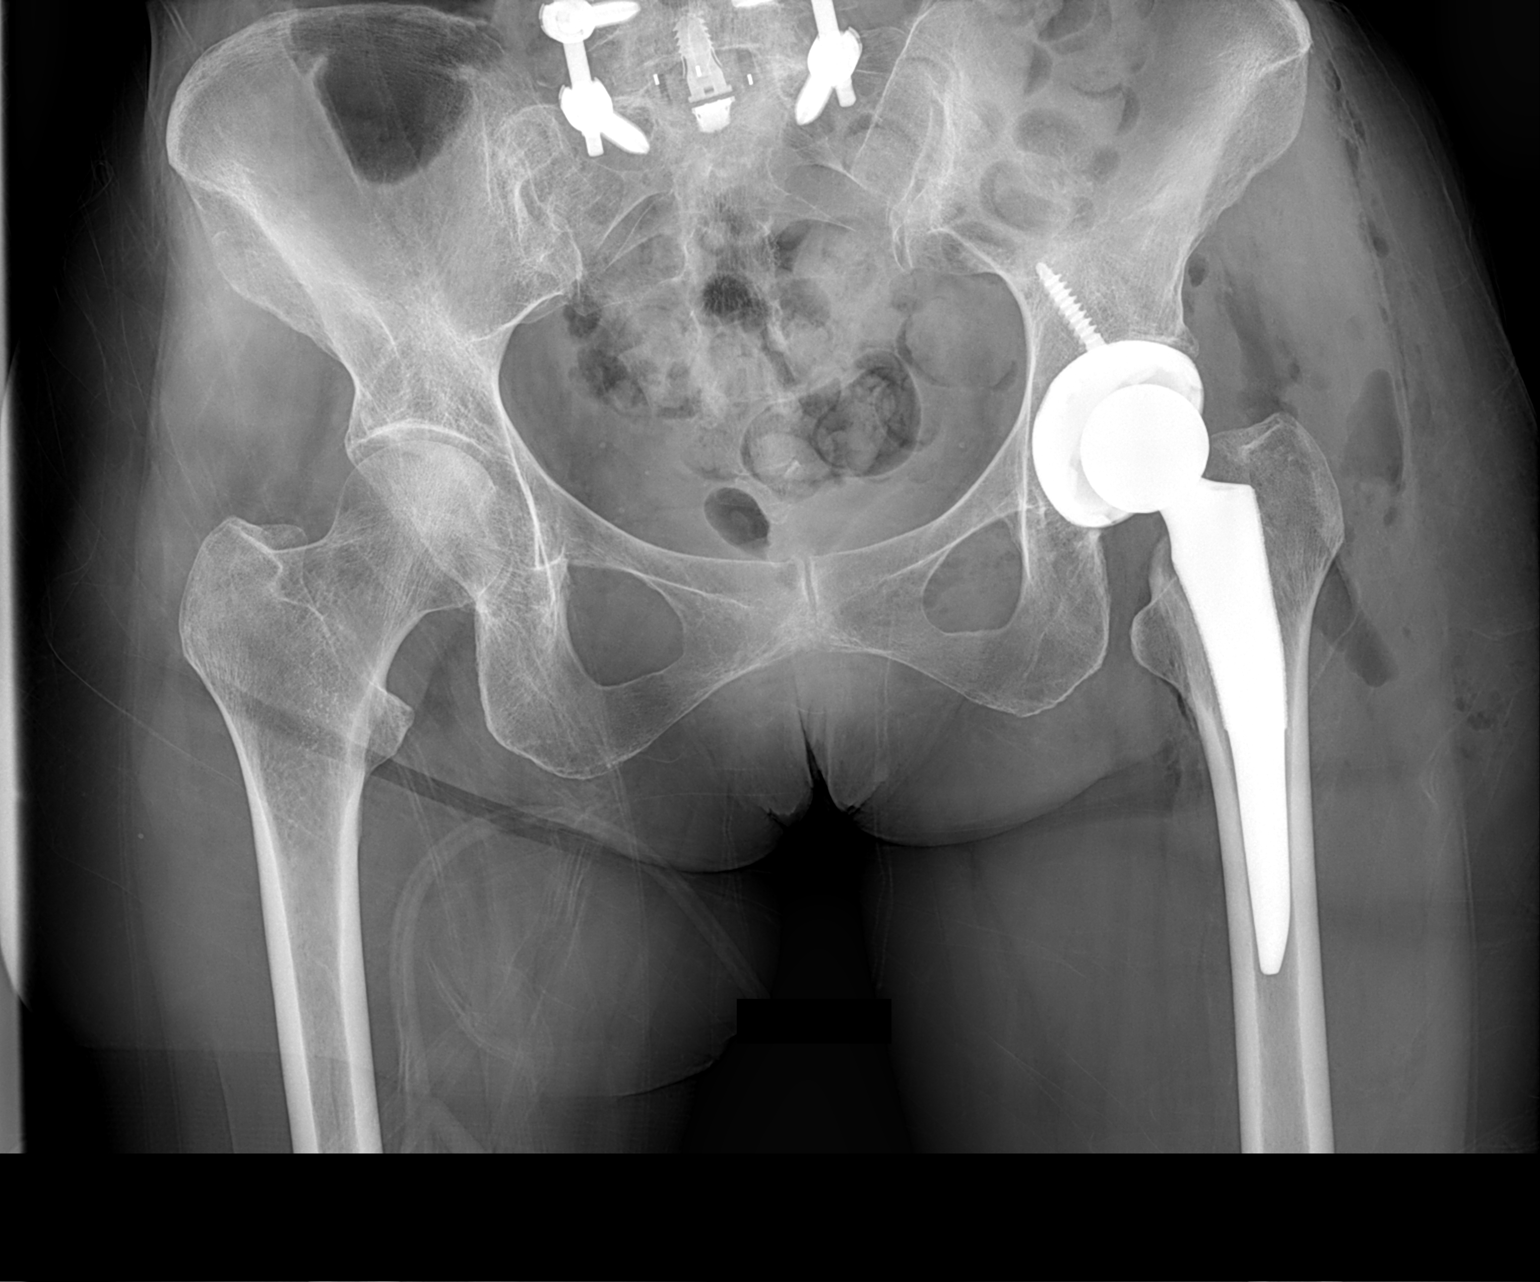
[im 2/2]
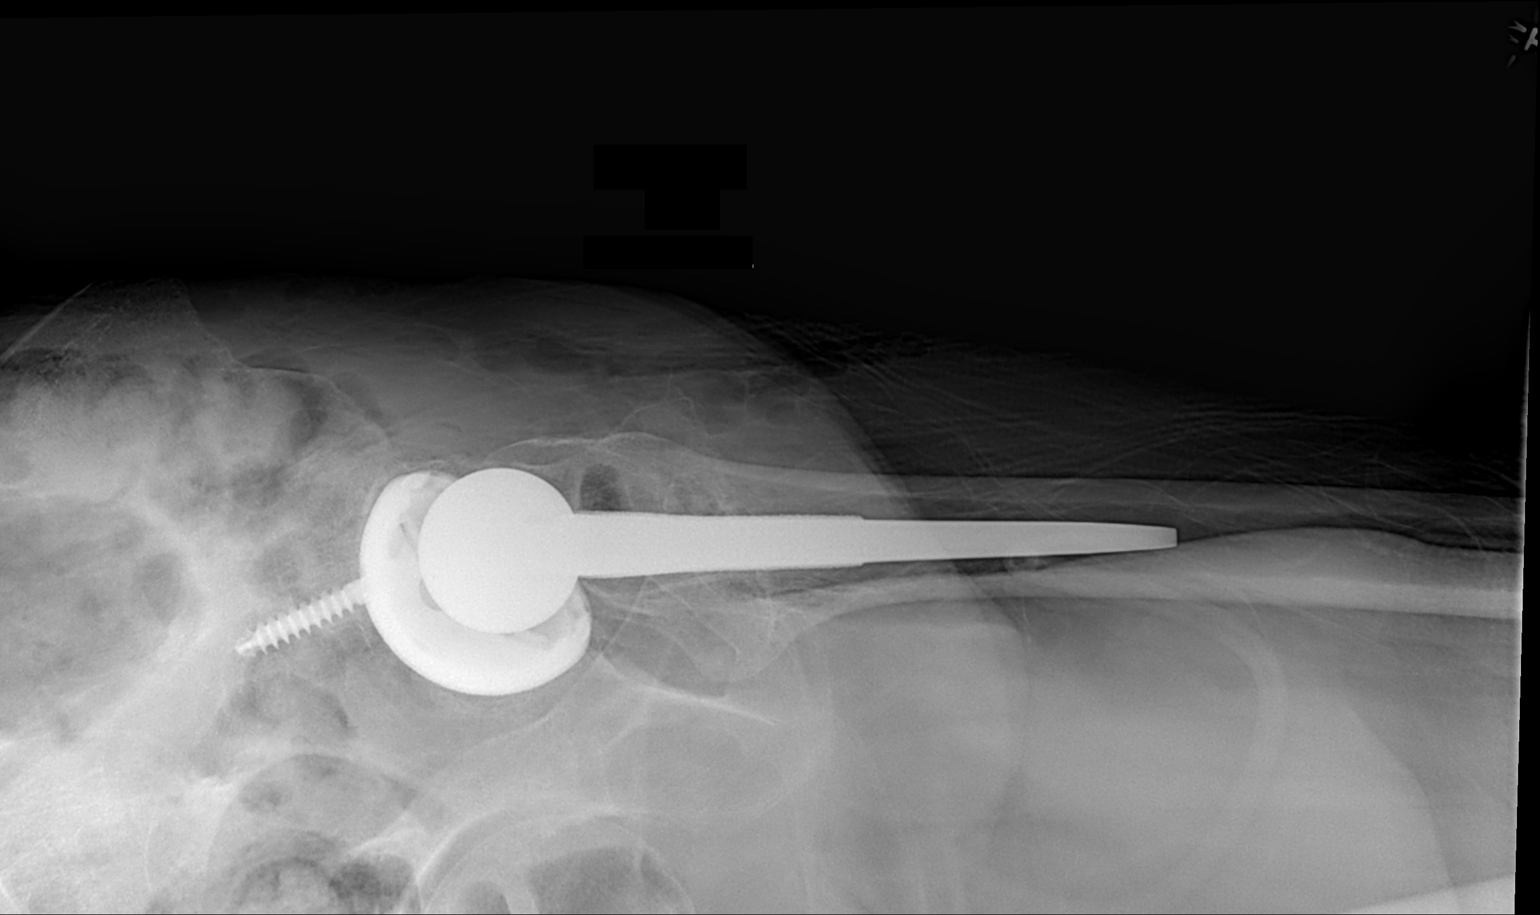

[2 of 2 positions shown; findings below may reference images not displayed]

FINDINGS: Left total hip arthroplasty is anatomically aligned. No breakage or
loosening of the hardware. Osteopenia. No fracture.
IMPRESSION: Left total hip arthroplasty anatomically aligned.

## 2016-10-17 ENCOUNTER — Ambulatory Visit (INDEPENDENT_AMBULATORY_CARE_PROVIDER_SITE_OTHER): Payer: Medicare Other | Admitting: Orthopaedic Surgery

## 2016-10-17 ENCOUNTER — Encounter (INDEPENDENT_AMBULATORY_CARE_PROVIDER_SITE_OTHER): Payer: Self-pay | Admitting: Orthopaedic Surgery

## 2016-10-17 ENCOUNTER — Ambulatory Visit (INDEPENDENT_AMBULATORY_CARE_PROVIDER_SITE_OTHER): Payer: Medicare Other

## 2016-10-17 VITALS — Ht 62.0 in | Wt 110.0 lb

## 2016-10-17 DIAGNOSIS — M25552 Pain in left hip: Secondary | ICD-10-CM

## 2016-10-17 DIAGNOSIS — Z96642 Presence of left artificial hip joint: Secondary | ICD-10-CM

## 2016-10-17 DIAGNOSIS — M7062 Trochanteric bursitis, left hip: Secondary | ICD-10-CM | POA: Insufficient documentation

## 2016-10-17 MED ORDER — LIDOCAINE HCL 1 % IJ SOLN
3.0000 mL | INTRAMUSCULAR | Status: AC | PRN
  Administered 2016-10-17: 3 mL

## 2016-10-17 MED ORDER — METHYLPREDNISOLONE ACETATE 40 MG/ML IJ SUSP
40.0000 mg | INTRAMUSCULAR | Status: AC | PRN
  Administered 2016-10-17: 40 mg via INTRA_ARTICULAR

## 2016-10-17 NOTE — Progress Notes (Signed)
Office Visit Note   Patient: Madeline Gray           Date of Birth: 08-Apr-1945           MRN: [id number] Visit Date: 10/17/2016              Requested by: No referring provider defined for this encounter. PCP: Patient, No Pcp Per   Assessment & Plan: Visit Diagnoses:  1. Pain in left hip   2. Trochanteric bursitis, left hip   3. History of total hip replacement, left     Plan: I gave her reassurance that I do not see anything worrisome about her hip replacement. We will order a 3-phase bone scan since it's been well over a year since her surgery and we can see if there is any evidence of prosthetic loosening. It does not appear that the acetabular component is prominent across a chronic iliopsoas tendinitis and grossly I see no evidence of loosening of components and they appear to be in excellent position with normal offset compared to the other side as well as leg length. I certainly felt trying a trochanteric injection we worthwhile. We'll see her back in 4 weeks to see how she is doing overall. I may even center to our physiatry us for a psoas tendon injection.  Follow-Up Instructions: Return in about 4 weeks (around 11/14/2016).   Orders:  Orders Placed This Encounter  Procedures  . XR HIP UNILAT W OR W/O PELVIS 2-3 VIEWS LEFT   No orders of the defined types were placed in this encounter.     Procedures: Large Joint Inj Date/Time: 10/17/2016 2:59 PM Performed by: Kathryne HitchBLACKMAN, CHRISTOPHER Y Authorized by: Kathryne HitchBLACKMAN, CHRISTOPHER Y   Location:  Hip Site:  L greater trochanter Ultrasound Guidance: No   Fluoroscopic Guidance: No   Arthrogram: No   Medications:  3 mL lidocaine 1 %; 40 mg methylPREDNISolone acetate 40 MG/ML     Clinical Data: No additional findings.   Subjective: Chief Complaint  Patient presents with  . Left Hip - Pain  The patient comes in today for second opinion as a relates to her left hip. She had a left total hip arthroplasty in December 2016  by another [facility] advisers[facility]geon in town. This was done through a direct anterior approach. She said that surgery went well however when she awoke from the surgery she's had still pain in her left groin since then. Of note she is someone who is on chronic narcotic pain medication and has had back surgery quite significant as well. She says the pain is in the groin but also on the lateral aspect of her hip. She is very thin individual.  HPI  Review of Systems She currently denies any headache, chest pain, shortness of breath, fever, chills, nausea, vomiting.  Objective: Vital Signs: Ht 5\' 2"  (1.575 m)   Wt 110 lb (49.9 kg)   BMI 20.12 kg/m   Physical Exam She is alert and oriented 3 and in no acute distress Ortho Exam Examination of her right hip shows well-healed surgical incision. She is neurovascular intact around the hip itself. The hip feels stable and has full range of motion albeit it is painful in the groin area and over the trochanteric area. Certainly some of his pain seems to be out of proportion with exam and may be secondary to heightened pain centers from being on chronic pain medications. Specialty Comments:  No specialty comments available.  Imaging: Xr Hip Unilat W Or W/o  Pelvis 2-3 Views Left  Result Date: 10/17/2016 An AP pelvis shows a well-seated implant with no, getting features from a previous left total hip arthroplasty. I see no evidence of osteolysis or loosening of the components. The position looks great. The leg lengths are equal.    PMFS History: Patient Active Problem List   Diagnosis Date Noted  . Trochanteric bursitis, left hip 10/17/2016  . Pain in left hip 10/17/2016  . S/P left THA, AA 02/22/2015  . Status post lumbar spine operation 03/03/2014  . Low back pain with sciatica 01/25/2014  . Chronic pain 07/08/2013  . Hypercholesterolemia 07/08/2013  . Adult hypothyroidism 07/08/2013  . Arthritis, degenerative 07/08/2013   Past Medical History:  Diagnosis  Date  . Arthritis    oa  . Hypothyroidism    no current meds for    Family History  Problem Relation Age of Onset  . Breast cancer Maternal Aunt     Past Surgical History:  Procedure Laterality Date  . ABDOMINAL HYSTERECTOMY     complete  . AUGMENTATION MAMMAPLASTY Bilateral 04/02/2004  . BACK SURGERY     L 5 to S 1 rods and screws surgery x 3  . BREAST ENHANCEMENT SURGERY Bilateral 2006  . BUNIONECTOMY Bilateral   . hip arthroscopic surgery Left   . NASAL SINUS SURGERY    . TOTAL HIP ARTHROPLASTY Left 02/22/2015   Procedure: TOTAL LEFT HIP ARTHROPLASTY ANTERIOR APPROACH;  Surgeon: Durene Romans, MD;  Location: [facility];  Service: Orthopedics;  Laterality: Left;   Social History   Occupational History  . Not on file.   Social History Main Topics  . Smoking status: Never Smoker  . Smokeless tobacco: Never Used  . Alcohol use No  . Drug use: No  . Sexual activity: Not on file

## 2016-10-18 ENCOUNTER — Encounter (INDEPENDENT_AMBULATORY_CARE_PROVIDER_SITE_OTHER): Payer: Self-pay | Admitting: *Deleted

## 2016-10-22 DIAGNOSIS — M961 Postlaminectomy syndrome, not elsewhere classified: Secondary | ICD-10-CM | POA: Diagnosis not present

## 2016-10-22 DIAGNOSIS — M4608 Spinal enthesopathy, sacral and sacrococcygeal region: Secondary | ICD-10-CM | POA: Diagnosis not present

## 2016-10-22 DIAGNOSIS — G894 Chronic pain syndrome: Secondary | ICD-10-CM | POA: Diagnosis not present

## 2016-10-22 DIAGNOSIS — M47817 Spondylosis without myelopathy or radiculopathy, lumbosacral region: Secondary | ICD-10-CM | POA: Diagnosis not present

## 2016-10-24 ENCOUNTER — Encounter (HOSPITAL_COMMUNITY): Payer: Medicare Other

## 2016-11-02 ENCOUNTER — Encounter (HOSPITAL_COMMUNITY): Payer: Medicare Other

## 2016-11-09 ENCOUNTER — Encounter (HOSPITAL_COMMUNITY)
Admission: RE | Admit: 2016-11-09 | Discharge: 2016-11-09 | Disposition: A | Discharge status: Home / Self Care | Payer: Medicare Other | Source: Ambulatory Visit | Attending: Orthopaedic Surgery | Admitting: Orthopaedic Surgery

## 2016-11-09 DIAGNOSIS — Z96642 Presence of left artificial hip joint: Secondary | ICD-10-CM | POA: Insufficient documentation

## 2016-11-09 DIAGNOSIS — M25552 Pain in left hip: Secondary | ICD-10-CM | POA: Diagnosis not present

## 2016-11-09 DIAGNOSIS — Z471 Aftercare following joint replacement surgery: Secondary | ICD-10-CM | POA: Diagnosis not present

## 2016-11-09 MED ORDER — TECHNETIUM TC 99M MEDRONATE IV KIT
25.0000 | PACK | Freq: Once | INTRAVENOUS | Status: AC | PRN
  Administered 2016-11-09: 25 via INTRAVENOUS

## 2016-11-15 ENCOUNTER — Encounter (INDEPENDENT_AMBULATORY_CARE_PROVIDER_SITE_OTHER): Payer: Self-pay | Admitting: Orthopaedic Surgery

## 2016-11-15 ENCOUNTER — Ambulatory Visit (INDEPENDENT_AMBULATORY_CARE_PROVIDER_SITE_OTHER): Payer: Medicare Other | Admitting: Orthopaedic Surgery

## 2016-11-15 DIAGNOSIS — M25552 Pain in left hip: Secondary | ICD-10-CM | POA: Insufficient documentation

## 2016-11-15 DIAGNOSIS — Z96642 Presence of left artificial hip joint: Secondary | ICD-10-CM | POA: Diagnosis not present

## 2016-11-15 NOTE — Progress Notes (Signed)
The patient is someone who is following up after 3 phase bone scan of her left hip. She had left total hip arthroplasty by another surgeon in town in December 2016. She is hurt since day one following surgery. She is also had back surgery but this had follow-up CT scans and myelograms and her back surgeon does not feel that there is a back issue although she has had apparently some disc issues. Again she has hurt since day one following her hip replacement. Plain films are reviewed and obtained showed no complication features of her left hip replacement. A 3 phase bone scan which is following up with today she has minimal uptake but not consistent with the pain that she is having. I see no evidence infection on exam. I can easily move her left hip around but she says it hurts mainly with weightbearing.  At this point we long and thorough discussion about other options. Right now not convinced more surgery on her hip is warranted. I would like to send her to Dr. Alvester MorinNewton to consider an anesthetic injection of lidocaine or Marcaine around her left hip and even potentially an aspiration to see if we can assess more closely as source of her left hip pain. We will work on making that appointment and I will talk to Dr. Alvester MorinNewton myself about seeing her back after this procedure. All questions were encouraged and answered.

## 2016-11-16 ENCOUNTER — Other Ambulatory Visit (INDEPENDENT_AMBULATORY_CARE_PROVIDER_SITE_OTHER): Payer: Self-pay

## 2016-11-16 DIAGNOSIS — M25552 Pain in left hip: Secondary | ICD-10-CM

## 2016-11-19 DIAGNOSIS — M47817 Spondylosis without myelopathy or radiculopathy, lumbosacral region: Secondary | ICD-10-CM | POA: Diagnosis not present

## 2016-11-19 DIAGNOSIS — M961 Postlaminectomy syndrome, not elsewhere classified: Secondary | ICD-10-CM | POA: Diagnosis not present

## 2016-11-19 DIAGNOSIS — G894 Chronic pain syndrome: Secondary | ICD-10-CM | POA: Diagnosis not present

## 2016-11-19 DIAGNOSIS — M4608 Spinal enthesopathy, sacral and sacrococcygeal region: Secondary | ICD-10-CM | POA: Diagnosis not present

## 2016-11-22 ENCOUNTER — Other Ambulatory Visit: Payer: Self-pay | Admitting: Anesthesiology

## 2016-11-22 ENCOUNTER — Ambulatory Visit
Admission: RE | Admit: 2016-11-22 | Discharge: 2016-11-22 | Disposition: A | Discharge status: Home / Self Care | Payer: Medicare Other | Source: Ambulatory Visit | Attending: Anesthesiology | Admitting: Anesthesiology

## 2016-11-22 DIAGNOSIS — M549 Dorsalgia, unspecified: Secondary | ICD-10-CM

## 2016-11-22 DIAGNOSIS — M5136 Other intervertebral disc degeneration, lumbar region: Secondary | ICD-10-CM | POA: Diagnosis not present

## 2016-11-23 ENCOUNTER — Ambulatory Visit (INDEPENDENT_AMBULATORY_CARE_PROVIDER_SITE_OTHER): Payer: Medicare Other | Admitting: Physical Medicine and Rehabilitation

## 2016-11-28 ENCOUNTER — Ambulatory Visit (INDEPENDENT_AMBULATORY_CARE_PROVIDER_SITE_OTHER): Payer: Medicare Other | Admitting: Physical Medicine and Rehabilitation

## 2016-12-10 ENCOUNTER — Ambulatory Visit (INDEPENDENT_AMBULATORY_CARE_PROVIDER_SITE_OTHER): Payer: Medicare Other | Admitting: Physical Medicine and Rehabilitation

## 2016-12-10 ENCOUNTER — Encounter (INDEPENDENT_AMBULATORY_CARE_PROVIDER_SITE_OTHER): Payer: Self-pay | Admitting: Physical Medicine and Rehabilitation

## 2016-12-10 ENCOUNTER — Ambulatory Visit (INDEPENDENT_AMBULATORY_CARE_PROVIDER_SITE_OTHER): Payer: Self-pay

## 2016-12-10 DIAGNOSIS — M25552 Pain in left hip: Secondary | ICD-10-CM

## 2016-12-10 DIAGNOSIS — Z96642 Presence of left artificial hip joint: Secondary | ICD-10-CM

## 2016-12-10 NOTE — Progress Notes (Signed)
Madeline Gray - 71 y.o. female MRN [id number]  Date of birth: 11-18-1945  Office Visit Note: Visit Date: 12/10/2016 PCP: Lewis Moccasinewey, Elizabeth R, MD Referred by: No ref. provider found  Subjective: Chief Complaint  Patient presents with  . Left Hip - Pain   HPI: Madeline Gray is a 71 year old female with prior history of lumbar spine fusion at L5-S1 and left total hip arthroplasty by Dr.Olin 2016. She's had chronic worsening left hip and groin pain since that time. She reports left hip and groin pain into the thigh which is constant. It is worse with walking and standing. She gets some relief with lying. She has had imaging including three-phase bone scan and Dr. Magnus IvanBlackman request diagnostic aspiration and Marcaine injection along the neck and femoral head component of the replacement.    ROS Otherwise per HPI.  Assessment & Plan: Visit Diagnoses:  1. Pain in left hip   2. Status post left hip replacement     Plan: Findings:  Successful placement of 20-gauge Touhy needle at the base of the femoral head component. We were able to aspirate really only the contrast dye that was placed. We did place 10 mL of 0.5% Marcaine. The patient did have some relief during the anesthetic phase and she was told to monitor this for the next few hours and she'll follow-up with Dr. Magnus IvanBlackman. Appointment was made for him.    Meds & Orders: No orders of the defined types were placed in this encounter.   Orders Placed This Encounter  Procedures  . Large Joint Injection/Arthrocentesis  . XR C-ARM NO REPORT    Follow-up: Return if symptoms worsen or fail to improve, for Dr. Magnus IvanBlackman.   Procedures: Left hip replacement aspiration and Marcaine injection with fluoroscopic guidance Date/Time: 12/10/2016 1:53 PM Performed by: Tyrell AntonioNEWTON, Kelen Laura Authorized by: Tyrell AntonioNEWTON, Ladarrion Telfair   Consent Given by:  Patient Site marked: the procedure site was marked   Timeout: prior to procedure the correct patient, procedure,  and site was verified   Indications:  Pain and diagnostic evaluation Location:  Hip Site:  L hip joint Prep: patient was prepped and draped in usual sterile fashion   Needle gauge: 20 G. Needle Length:  3.5 inches (Tuohy) Approach:  Anterior Ultrasound Guidance: No   Fluoroscopic Guidance: Yes   Arthrogram: No   Medications:  9 mL bupivacaine 0.5 % Aspiration Attempted: Yes   Patient tolerance:  Patient tolerated the procedure well with no immediate complications  Biplanar imaging was utilized to place the needle at the femoral head component. We were able to see contrast dye throughout the acetabular cup. We were able to aspirate the contrast solution back out but with no other aspiration. Patient did seem to have some relief during the anesthetic phase.     No notes on file   Clinical History: No specialty comments available.  She reports that she has never smoked. She has never used smokeless tobacco. No results for input(s): HGBA1C, LABURIC in the last 8760 hours.  Objective:  VS:  HT:    WT:   BMI:     BP:   HR: bpm  TEMP: ( )  RESP:  Physical Exam  Ortho Exam Imaging: Xr C-arm No Report  Result Date: 12/10/2016 Please see Notes or Procedures tab for imaging impression.   Past Medical/Family/Surgical/Social History: Medications & Allergies reviewed per EMR Patient Active Problem List   Diagnosis Date Noted  . Pain of left hip joint 11/15/2016  . Trochanteric bursitis,  left hip 10/17/2016  . Pain in left hip 10/17/2016  . S/P left THA, AA 02/22/2015  . Status post lumbar spine operation 03/03/2014  . Low back pain with sciatica 01/25/2014  . Chronic pain 07/08/2013  . Hypercholesterolemia 07/08/2013  . Adult hypothyroidism 07/08/2013  . Arthritis, degenerative 07/08/2013   Past Medical History:  Diagnosis Date  . Arthritis    oa  . Hypothyroidism    no current meds for   Family History  Problem Relation Age of Onset  . Breast cancer Maternal Aunt     Past Surgical History:  Procedure Laterality Date  . ABDOMINAL HYSTERECTOMY     complete  . AUGMENTATION MAMMAPLASTY Bilateral 04/02/2004  . BACK SURGERY     L 5 to S 1 rods and screws surgery x 3  . BREAST ENHANCEMENT SURGERY Bilateral 2006  . BUNIONECTOMY Bilateral   . hip arthroscopic surgery Left   . NASAL SINUS SURGERY    . TOTAL HIP ARTHROPLASTY Left 02/22/2015   Procedure: TOTAL LEFT HIP ARTHROPLASTY ANTERIOR APPROACH;  Surgeon: Durene Romans, MD;  Location: [facility];  Service: Orthopedics;  Laterality: Left;   Social History   Occupational History  . Not on file.   Social History Main Topics  . Smoking status: Never Smoker  . Smokeless tobacco: Never Used  . Alcohol use No  . Drug use: No  . Sexual activity: Not on file

## 2016-12-10 NOTE — Progress Notes (Deleted)
Left hip and groin pain into thigh. Constant pain. Worse with walking and standing. Relief with laying

## 2016-12-11 MED ORDER — BUPIVACAINE HCL 0.5 % IJ SOLN
9.0000 mL | INTRAMUSCULAR | Status: AC | PRN
  Administered 2016-12-10: 9 mL via INTRA_ARTICULAR

## 2016-12-21 DIAGNOSIS — M961 Postlaminectomy syndrome, not elsewhere classified: Secondary | ICD-10-CM | POA: Diagnosis not present

## 2016-12-21 DIAGNOSIS — M47817 Spondylosis without myelopathy or radiculopathy, lumbosacral region: Secondary | ICD-10-CM | POA: Diagnosis not present

## 2016-12-21 DIAGNOSIS — G894 Chronic pain syndrome: Secondary | ICD-10-CM | POA: Diagnosis not present

## 2016-12-21 DIAGNOSIS — Z79891 Long term (current) use of opiate analgesic: Secondary | ICD-10-CM | POA: Diagnosis not present

## 2016-12-31 ENCOUNTER — Ambulatory Visit (INDEPENDENT_AMBULATORY_CARE_PROVIDER_SITE_OTHER): Payer: Medicare Other | Admitting: Orthopaedic Surgery

## 2016-12-31 DIAGNOSIS — M25552 Pain in left hip: Secondary | ICD-10-CM | POA: Diagnosis not present

## 2016-12-31 NOTE — Progress Notes (Signed)
The patient is coming in for continued evaluation of chronic left hip pain. She had a total hip arthroplasty done by another surgeon in town 22 months ago. She says she had a severe pain before surgery and is never gone away and even worse after surgery. However she is on chronic narcotics and has had lumbar spine surgery as well. She takes 10 mg of oxycodone 4 times a day. I did send her for a 3 phase bone scan that did not show any significant findings. There was trace uptake around 2 areas the femoral component but this was minimal and plain films do not show any evidence of loosening. Today I can still easily put her hip through range of motion and she hurts in the groin and on the side of the hip. I had Dr. Alvester Morin provide an intra-articular injection would just pain medication he said the injection was helpful for follow-up of she still having a lot of pain.  I'm at a loss of what else I can do for her. I'm not convinced that doing any further surgery on her hip is warranted given her pain centers are probably significantly heightened and off given the amount of narcotics she takes. I feel that further surgery may even worsen her pain more so and I feel that the components would be too difficult to get out with the significant damage to her. I do suggest that at this point she'll follow up with the other orthopedic surgeon in town to get his opinion as well.

## 2017-02-18 DIAGNOSIS — G894 Chronic pain syndrome: Secondary | ICD-10-CM | POA: Diagnosis not present

## 2017-02-18 DIAGNOSIS — Z79891 Long term (current) use of opiate analgesic: Secondary | ICD-10-CM | POA: Diagnosis not present

## 2017-02-18 DIAGNOSIS — M961 Postlaminectomy syndrome, not elsewhere classified: Secondary | ICD-10-CM | POA: Diagnosis not present

## 2017-02-18 DIAGNOSIS — M47817 Spondylosis without myelopathy or radiculopathy, lumbosacral region: Secondary | ICD-10-CM | POA: Diagnosis not present

## 2017-03-18 DIAGNOSIS — M961 Postlaminectomy syndrome, not elsewhere classified: Secondary | ICD-10-CM | POA: Diagnosis not present

## 2017-03-18 DIAGNOSIS — G894 Chronic pain syndrome: Secondary | ICD-10-CM | POA: Diagnosis not present

## 2017-03-18 DIAGNOSIS — M47817 Spondylosis without myelopathy or radiculopathy, lumbosacral region: Secondary | ICD-10-CM | POA: Diagnosis not present

## 2017-03-18 DIAGNOSIS — Z79891 Long term (current) use of opiate analgesic: Secondary | ICD-10-CM | POA: Diagnosis not present

## 2017-04-17 DIAGNOSIS — M47817 Spondylosis without myelopathy or radiculopathy, lumbosacral region: Secondary | ICD-10-CM | POA: Diagnosis not present

## 2017-04-17 DIAGNOSIS — Z79891 Long term (current) use of opiate analgesic: Secondary | ICD-10-CM | POA: Diagnosis not present

## 2017-04-17 DIAGNOSIS — G894 Chronic pain syndrome: Secondary | ICD-10-CM | POA: Diagnosis not present

## 2017-04-17 DIAGNOSIS — M961 Postlaminectomy syndrome, not elsewhere classified: Secondary | ICD-10-CM | POA: Diagnosis not present

## 2017-05-15 DIAGNOSIS — G894 Chronic pain syndrome: Secondary | ICD-10-CM | POA: Diagnosis not present

## 2017-05-15 DIAGNOSIS — M961 Postlaminectomy syndrome, not elsewhere classified: Secondary | ICD-10-CM | POA: Diagnosis not present

## 2017-05-15 DIAGNOSIS — Z79891 Long term (current) use of opiate analgesic: Secondary | ICD-10-CM | POA: Diagnosis not present

## 2017-05-15 DIAGNOSIS — M47817 Spondylosis without myelopathy or radiculopathy, lumbosacral region: Secondary | ICD-10-CM | POA: Diagnosis not present

## 2017-06-12 DIAGNOSIS — M47817 Spondylosis without myelopathy or radiculopathy, lumbosacral region: Secondary | ICD-10-CM | POA: Diagnosis not present

## 2017-06-12 DIAGNOSIS — M961 Postlaminectomy syndrome, not elsewhere classified: Secondary | ICD-10-CM | POA: Diagnosis not present

## 2017-06-12 DIAGNOSIS — G894 Chronic pain syndrome: Secondary | ICD-10-CM | POA: Diagnosis not present

## 2017-06-12 DIAGNOSIS — Z79891 Long term (current) use of opiate analgesic: Secondary | ICD-10-CM | POA: Diagnosis not present

## 2017-07-10 DIAGNOSIS — M47817 Spondylosis without myelopathy or radiculopathy, lumbosacral region: Secondary | ICD-10-CM | POA: Diagnosis not present

## 2017-07-10 DIAGNOSIS — G894 Chronic pain syndrome: Secondary | ICD-10-CM | POA: Diagnosis not present

## 2017-07-10 DIAGNOSIS — Z79891 Long term (current) use of opiate analgesic: Secondary | ICD-10-CM | POA: Diagnosis not present

## 2017-07-10 DIAGNOSIS — M961 Postlaminectomy syndrome, not elsewhere classified: Secondary | ICD-10-CM | POA: Diagnosis not present

## 2017-08-07 DIAGNOSIS — Z79891 Long term (current) use of opiate analgesic: Secondary | ICD-10-CM | POA: Diagnosis not present

## 2017-08-07 DIAGNOSIS — M961 Postlaminectomy syndrome, not elsewhere classified: Secondary | ICD-10-CM | POA: Diagnosis not present

## 2017-08-07 DIAGNOSIS — G894 Chronic pain syndrome: Secondary | ICD-10-CM | POA: Diagnosis not present

## 2017-08-07 DIAGNOSIS — M47817 Spondylosis without myelopathy or radiculopathy, lumbosacral region: Secondary | ICD-10-CM | POA: Diagnosis not present

## 2017-09-04 DIAGNOSIS — Z79891 Long term (current) use of opiate analgesic: Secondary | ICD-10-CM | POA: Diagnosis not present

## 2017-09-04 DIAGNOSIS — G894 Chronic pain syndrome: Secondary | ICD-10-CM | POA: Diagnosis not present

## 2017-09-04 DIAGNOSIS — M961 Postlaminectomy syndrome, not elsewhere classified: Secondary | ICD-10-CM | POA: Diagnosis not present

## 2017-09-04 DIAGNOSIS — M47817 Spondylosis without myelopathy or radiculopathy, lumbosacral region: Secondary | ICD-10-CM | POA: Diagnosis not present

## 2017-10-02 DIAGNOSIS — M961 Postlaminectomy syndrome, not elsewhere classified: Secondary | ICD-10-CM | POA: Diagnosis not present

## 2017-10-02 DIAGNOSIS — G894 Chronic pain syndrome: Secondary | ICD-10-CM | POA: Diagnosis not present

## 2017-10-02 DIAGNOSIS — Z79891 Long term (current) use of opiate analgesic: Secondary | ICD-10-CM | POA: Diagnosis not present

## 2017-10-02 DIAGNOSIS — M47817 Spondylosis without myelopathy or radiculopathy, lumbosacral region: Secondary | ICD-10-CM | POA: Diagnosis not present

## 2017-10-18 DIAGNOSIS — Z0000 Encounter for general adult medical examination without abnormal findings: Secondary | ICD-10-CM | POA: Diagnosis not present

## 2017-12-10 DIAGNOSIS — G894 Chronic pain syndrome: Secondary | ICD-10-CM | POA: Diagnosis not present

## 2017-12-10 DIAGNOSIS — M961 Postlaminectomy syndrome, not elsewhere classified: Secondary | ICD-10-CM | POA: Diagnosis not present

## 2017-12-10 DIAGNOSIS — Z79891 Long term (current) use of opiate analgesic: Secondary | ICD-10-CM | POA: Diagnosis not present

## 2017-12-10 DIAGNOSIS — M47817 Spondylosis without myelopathy or radiculopathy, lumbosacral region: Secondary | ICD-10-CM | POA: Diagnosis not present

## 2018-01-08 DIAGNOSIS — M961 Postlaminectomy syndrome, not elsewhere classified: Secondary | ICD-10-CM | POA: Diagnosis not present

## 2018-01-08 DIAGNOSIS — M47817 Spondylosis without myelopathy or radiculopathy, lumbosacral region: Secondary | ICD-10-CM | POA: Diagnosis not present

## 2018-01-08 DIAGNOSIS — Z79891 Long term (current) use of opiate analgesic: Secondary | ICD-10-CM | POA: Diagnosis not present

## 2018-01-08 DIAGNOSIS — G894 Chronic pain syndrome: Secondary | ICD-10-CM | POA: Diagnosis not present

## 2018-02-05 DIAGNOSIS — Z79891 Long term (current) use of opiate analgesic: Secondary | ICD-10-CM | POA: Diagnosis not present

## 2018-02-05 DIAGNOSIS — M961 Postlaminectomy syndrome, not elsewhere classified: Secondary | ICD-10-CM | POA: Diagnosis not present

## 2018-02-05 DIAGNOSIS — M47817 Spondylosis without myelopathy or radiculopathy, lumbosacral region: Secondary | ICD-10-CM | POA: Diagnosis not present

## 2018-02-05 DIAGNOSIS — G894 Chronic pain syndrome: Secondary | ICD-10-CM | POA: Diagnosis not present

## 2018-03-10 DIAGNOSIS — M47817 Spondylosis without myelopathy or radiculopathy, lumbosacral region: Secondary | ICD-10-CM | POA: Diagnosis not present

## 2018-03-10 DIAGNOSIS — G894 Chronic pain syndrome: Secondary | ICD-10-CM | POA: Diagnosis not present

## 2018-03-10 DIAGNOSIS — M961 Postlaminectomy syndrome, not elsewhere classified: Secondary | ICD-10-CM | POA: Diagnosis not present

## 2018-03-10 DIAGNOSIS — Z79891 Long term (current) use of opiate analgesic: Secondary | ICD-10-CM | POA: Diagnosis not present

## 2018-04-09 DIAGNOSIS — G894 Chronic pain syndrome: Secondary | ICD-10-CM | POA: Diagnosis not present

## 2018-04-09 DIAGNOSIS — R110 Nausea: Secondary | ICD-10-CM | POA: Diagnosis not present

## 2018-04-09 DIAGNOSIS — M961 Postlaminectomy syndrome, not elsewhere classified: Secondary | ICD-10-CM | POA: Diagnosis not present

## 2018-04-09 DIAGNOSIS — M47817 Spondylosis without myelopathy or radiculopathy, lumbosacral region: Secondary | ICD-10-CM | POA: Diagnosis not present

## 2018-05-14 DIAGNOSIS — M47817 Spondylosis without myelopathy or radiculopathy, lumbosacral region: Secondary | ICD-10-CM | POA: Diagnosis not present

## 2018-05-14 DIAGNOSIS — G894 Chronic pain syndrome: Secondary | ICD-10-CM | POA: Diagnosis not present

## 2018-05-14 DIAGNOSIS — M961 Postlaminectomy syndrome, not elsewhere classified: Secondary | ICD-10-CM | POA: Diagnosis not present

## 2018-05-14 DIAGNOSIS — R110 Nausea: Secondary | ICD-10-CM | POA: Diagnosis not present

## 2018-06-18 DIAGNOSIS — R110 Nausea: Secondary | ICD-10-CM | POA: Diagnosis not present

## 2018-06-18 DIAGNOSIS — M961 Postlaminectomy syndrome, not elsewhere classified: Secondary | ICD-10-CM | POA: Diagnosis not present

## 2018-06-18 DIAGNOSIS — M47817 Spondylosis without myelopathy or radiculopathy, lumbosacral region: Secondary | ICD-10-CM | POA: Diagnosis not present

## 2018-06-18 DIAGNOSIS — G894 Chronic pain syndrome: Secondary | ICD-10-CM | POA: Diagnosis not present

## 2018-07-03 IMAGING — NM NM BONE 3 PHASE
10 series · 20 of 20 positions shown · non-contrast
Comparison: None

CLINICAL DATA: LEFT hip pain, prior LEFT total hip replacement
surgery in February 2015

EXAM:
NUCLEAR MEDICINE 3-PHASE BONE SCAN
TECHNIQUE: Radionuclide angiographic images, immediate static blood pool
images, and 3-hour delayed static images were obtained of the hips
after intravenous injection of radiopharmaceutical.
RADIOPHARMACEUTICALS:  21 mCi 6c-GGm MDP IV new line radiographic
correlation: 02/22/2015

[Series 1: flow · 2.07mm/px · 6 of 48 frames shown (1 of 2)]
[frame 5/48  full-range]
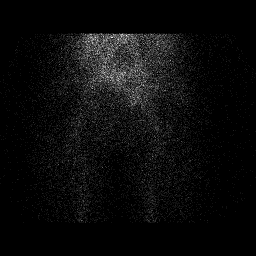
[frame 13/48  full-range]
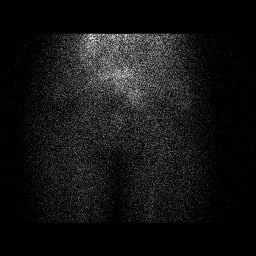
[frame 21/48  full-range]
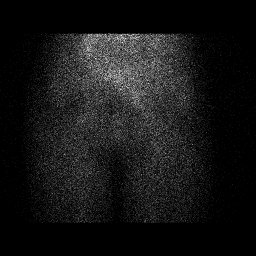
[frame 29/48  full-range]
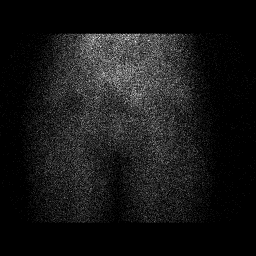
[frame 37/48  full-range]
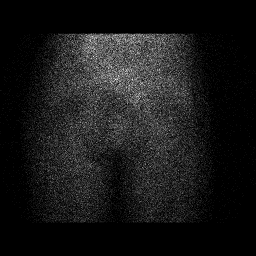
[frame 45/48  full-range]
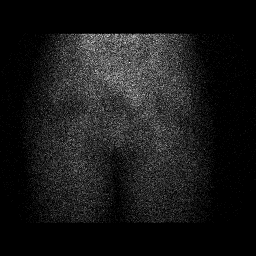

[Series 1: flow · 2.07mm/px · 6 of 48 frames shown (2 of 2)]
[frame 5/48  full-range]
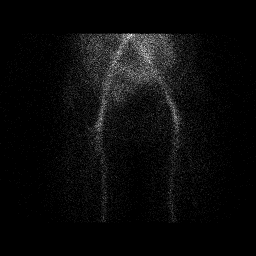
[frame 13/48  full-range]
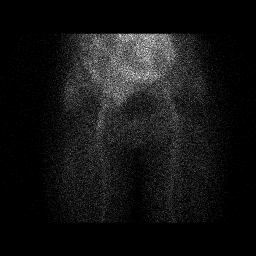
[frame 21/48  full-range]
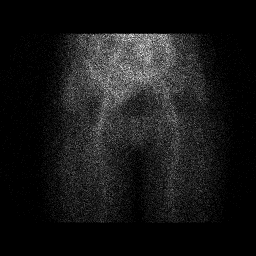
[frame 29/48  full-range]
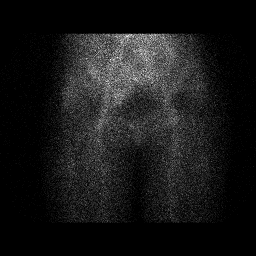
[frame 37/48  full-range]
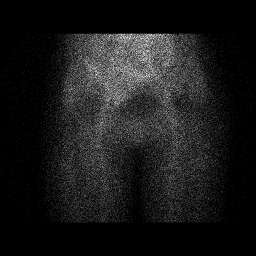
[frame 45/48  full-range]
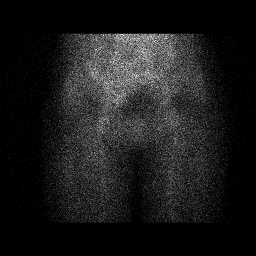

[Series 3: blood pool · 2.07mm/px · 1 of 1 slices shown (1 of 6)]
[im 1/1]
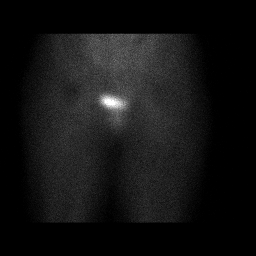

[Series 3: blood pool · 2.07mm/px · 1 of 1 slices shown (2 of 6)]
[im 1/1]
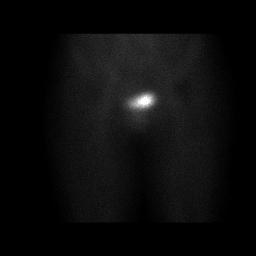

[Series 4: lat bp · 2.07mm/px · 1 of 1 slices shown (1 of 2)]
[im 1/1]
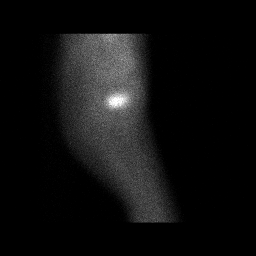

[Series 4: lat bp · 2.07mm/px · 1 of 1 slices shown (2 of 2)]
[im 1/1]
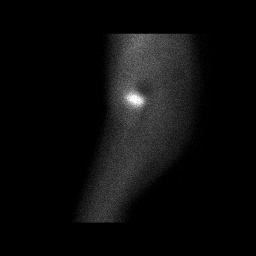

[Series 5: blood pool · 2.07mm/px · 1 of 1 slices shown (3 of 6)]
[im 1/1]
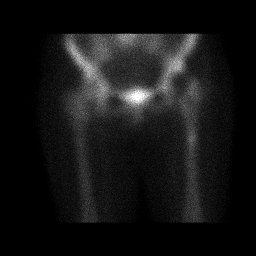

[Series 5: blood pool · 2.07mm/px · 1 of 1 slices shown (4 of 6)]
[im 1/1]
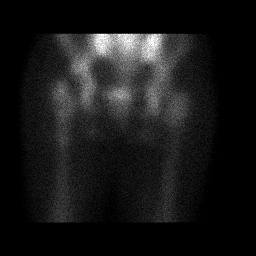

[Series 6: blood pool · 2.07mm/px · 1 of 1 slices shown (5 of 6)]
[im 1/1]
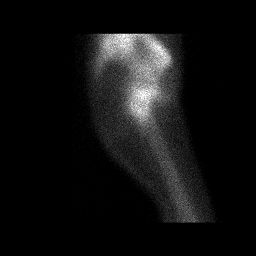

[Series 6: blood pool · 2.07mm/px · 1 of 1 slices shown (6 of 6)]
[im 1/1]
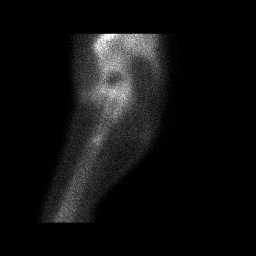

[20 of 20 positions shown; findings below may reference images not displayed]

FINDINGS: Vascular phase: Normal symmetric blood flow to both hips

Blood pool phase: Normal blood pool at both hips

Delayed phase: Photopenic defect LEFT hip consistent with hip
prosthesis. Minimal uptake identified at the distal aspect of the
femoral component. Only minimal abnormal uptake of tracer adjacent
to the proximal portion of the residual LEFT femur adjacent to the
prosthesis laterally. No abnormal pelvic or RIGHT hip localization
of tracer identified.
IMPRESSION: Normal blood flow and blood pool of tracer.

Subtle increased uptake of tracer at the tip of the femoral
component of the LEFT hip prosthesis and minimally in the lateral
margin/greater trochanteric region at the proximal portion of the
LEFT hip prosthesis.

Cannot exclude loosening of the LEFT hip prosthesis.

## 2018-08-13 DIAGNOSIS — R110 Nausea: Secondary | ICD-10-CM | POA: Diagnosis not present

## 2018-08-13 DIAGNOSIS — M961 Postlaminectomy syndrome, not elsewhere classified: Secondary | ICD-10-CM | POA: Diagnosis not present

## 2018-08-13 DIAGNOSIS — G894 Chronic pain syndrome: Secondary | ICD-10-CM | POA: Diagnosis not present

## 2018-08-13 DIAGNOSIS — M47817 Spondylosis without myelopathy or radiculopathy, lumbosacral region: Secondary | ICD-10-CM | POA: Diagnosis not present

## 2018-10-08 DIAGNOSIS — R110 Nausea: Secondary | ICD-10-CM | POA: Diagnosis not present

## 2018-10-08 DIAGNOSIS — M961 Postlaminectomy syndrome, not elsewhere classified: Secondary | ICD-10-CM | POA: Diagnosis not present

## 2018-10-08 DIAGNOSIS — G894 Chronic pain syndrome: Secondary | ICD-10-CM | POA: Diagnosis not present

## 2018-10-08 DIAGNOSIS — M47817 Spondylosis without myelopathy or radiculopathy, lumbosacral region: Secondary | ICD-10-CM | POA: Diagnosis not present

## 2018-12-16 DIAGNOSIS — G894 Chronic pain syndrome: Secondary | ICD-10-CM | POA: Diagnosis not present

## 2018-12-16 DIAGNOSIS — R110 Nausea: Secondary | ICD-10-CM | POA: Diagnosis not present

## 2018-12-16 DIAGNOSIS — M47817 Spondylosis without myelopathy or radiculopathy, lumbosacral region: Secondary | ICD-10-CM | POA: Diagnosis not present

## 2018-12-16 DIAGNOSIS — M961 Postlaminectomy syndrome, not elsewhere classified: Secondary | ICD-10-CM | POA: Diagnosis not present

## 2019-01-20 DIAGNOSIS — M47817 Spondylosis without myelopathy or radiculopathy, lumbosacral region: Secondary | ICD-10-CM | POA: Diagnosis not present

## 2019-01-20 DIAGNOSIS — M961 Postlaminectomy syndrome, not elsewhere classified: Secondary | ICD-10-CM | POA: Diagnosis not present

## 2019-01-20 DIAGNOSIS — G894 Chronic pain syndrome: Secondary | ICD-10-CM | POA: Diagnosis not present

## 2019-01-20 DIAGNOSIS — R110 Nausea: Secondary | ICD-10-CM | POA: Diagnosis not present

## 2019-03-17 DIAGNOSIS — G894 Chronic pain syndrome: Secondary | ICD-10-CM | POA: Diagnosis not present

## 2019-03-17 DIAGNOSIS — M47817 Spondylosis without myelopathy or radiculopathy, lumbosacral region: Secondary | ICD-10-CM | POA: Diagnosis not present

## 2019-03-17 DIAGNOSIS — M961 Postlaminectomy syndrome, not elsewhere classified: Secondary | ICD-10-CM | POA: Diagnosis not present

## 2019-03-17 DIAGNOSIS — R110 Nausea: Secondary | ICD-10-CM | POA: Diagnosis not present

## 2019-04-14 DIAGNOSIS — M47817 Spondylosis without myelopathy or radiculopathy, lumbosacral region: Secondary | ICD-10-CM | POA: Diagnosis not present

## 2019-04-14 DIAGNOSIS — R110 Nausea: Secondary | ICD-10-CM | POA: Diagnosis not present

## 2019-04-14 DIAGNOSIS — G894 Chronic pain syndrome: Secondary | ICD-10-CM | POA: Diagnosis not present

## 2019-04-14 DIAGNOSIS — M961 Postlaminectomy syndrome, not elsewhere classified: Secondary | ICD-10-CM | POA: Diagnosis not present

## 2019-05-12 DIAGNOSIS — M47817 Spondylosis without myelopathy or radiculopathy, lumbosacral region: Secondary | ICD-10-CM | POA: Diagnosis not present

## 2019-05-12 DIAGNOSIS — M961 Postlaminectomy syndrome, not elsewhere classified: Secondary | ICD-10-CM | POA: Diagnosis not present

## 2019-05-12 DIAGNOSIS — G894 Chronic pain syndrome: Secondary | ICD-10-CM | POA: Diagnosis not present

## 2019-05-12 DIAGNOSIS — R110 Nausea: Secondary | ICD-10-CM | POA: Diagnosis not present

## 2019-06-09 DIAGNOSIS — M47817 Spondylosis without myelopathy or radiculopathy, lumbosacral region: Secondary | ICD-10-CM | POA: Diagnosis not present

## 2019-06-09 DIAGNOSIS — M961 Postlaminectomy syndrome, not elsewhere classified: Secondary | ICD-10-CM | POA: Diagnosis not present

## 2019-06-09 DIAGNOSIS — G894 Chronic pain syndrome: Secondary | ICD-10-CM | POA: Diagnosis not present

## 2019-06-09 DIAGNOSIS — R110 Nausea: Secondary | ICD-10-CM | POA: Diagnosis not present

## 2019-08-04 DIAGNOSIS — R110 Nausea: Secondary | ICD-10-CM | POA: Diagnosis not present

## 2019-08-04 DIAGNOSIS — G894 Chronic pain syndrome: Secondary | ICD-10-CM | POA: Diagnosis not present

## 2019-08-04 DIAGNOSIS — M961 Postlaminectomy syndrome, not elsewhere classified: Secondary | ICD-10-CM | POA: Diagnosis not present

## 2019-08-04 DIAGNOSIS — M47817 Spondylosis without myelopathy or radiculopathy, lumbosacral region: Secondary | ICD-10-CM | POA: Diagnosis not present

## 2020-05-25 ENCOUNTER — Other Ambulatory Visit: Payer: Self-pay | Admitting: Anesthesiology

## 2020-05-25 DIAGNOSIS — M545 Low back pain, unspecified: Secondary | ICD-10-CM

## 2020-05-25 DIAGNOSIS — G8929 Other chronic pain: Secondary | ICD-10-CM

## 2020-05-26 ENCOUNTER — Telehealth: Payer: Medicare Other

## 2020-05-26 NOTE — Telephone Encounter (Signed)
Phone call to patient to verify medication list and allergies for myelogram procedure. Pt instructed to hold Phenegran for 48hrs prior to myelogram appointment time and 24 hours after appointment. Pt also instructed to have a driver the day of the procedure, the procedure would take around 2 hours, and discharge instructions discussed. Pt verbalized understanding.

## 2020-06-03 ENCOUNTER — Other Ambulatory Visit: Payer: Medicare Other

## 2020-06-03 ENCOUNTER — Inpatient Hospital Stay
Admission: RE | Admit: 2020-06-03 | Discharge: 2020-06-03 | Disposition: A | Discharge status: Home / Self Care | Payer: Medicare Other | Source: Ambulatory Visit | Attending: Anesthesiology | Admitting: Anesthesiology

## 2020-06-03 NOTE — Discharge Instructions (Signed)
Myelogram Discharge Instructions  1. Go home and rest quietly for the next 24 hours.  It is important to lie flat for the next 24 hours.  Get up only to go to the restroom.  You may lie in the bed or on a couch on your back, your stomach, your left side or your right side.  You may have one pillow under your head.  You may have pillows between your knees while you are on your side or under your knees while you are on your back.  2. DO NOT drive today.  Recline the seat as far back as it will go, while still wearing your seat belt, on the way home.  3. You may get up to go to the bathroom as needed.  You may sit up for 10 minutes to eat.  You may resume your normal diet and medications unless otherwise indicated.  Drink lots of extra fluids today and tomorrow.  4. The incidence of headache, nausea, or vomiting is about 5% (one in 20 patients).  If you develop a headache, lie flat and drink plenty of fluids until the headache goes away.  Caffeinated beverages may be helpful.  If you develop severe nausea and vomiting or a headache that does not go away with flat bed rest, call [phone].  5. You may resume normal activities after your 24 hours of bed rest is over; however, do not exert yourself strongly or do any heavy lifting tomorrow. If when you get up you have a headache when standing, go back to bed and force fluids for another 24 hours.  6. Call your physician for a follow-up appointment.  The results of your myelogram will be sent directly to your physician by the following day.  7. If you have any questions or if complications develop after you arrive home, please call [phone].  Discharge instructions have been explained to the patient.  The patient, or the person responsible for the patient, fully understands these instructions   YOU MAY TAKE YOUR PHENERGAN TOMORROW ON 06/04/20 @ 1PM OR THERERAFTER.

## 2022-02-12 ENCOUNTER — Other Ambulatory Visit: Payer: Self-pay

## 2022-02-12 ENCOUNTER — Encounter: Payer: Self-pay | Admitting: Neurology

## 2022-02-12 DIAGNOSIS — R202 Paresthesia of skin: Secondary | ICD-10-CM

## 2022-03-06 ENCOUNTER — Encounter: Payer: Medicare Other | Admitting: Neurology

## 2022-04-10 ENCOUNTER — Encounter: Payer: Medicare Other | Admitting: Neurology

## 2024-01-30 ENCOUNTER — Other Ambulatory Visit (HOSPITAL_BASED_OUTPATIENT_CLINIC_OR_DEPARTMENT_OTHER): Payer: Self-pay

## 2024-01-30 ENCOUNTER — Other Ambulatory Visit: Payer: Self-pay

## 2024-01-30 MED ORDER — OXYCODONE HCL 10 MG PO TABS
ORAL_TABLET | ORAL | 0 refills | Status: AC
  Filled 2024-01-30 (×2): qty 188, 30d supply, fill #0

## 2024-01-31 ENCOUNTER — Other Ambulatory Visit: Payer: Self-pay

## 2024-01-31 ENCOUNTER — Other Ambulatory Visit (HOSPITAL_BASED_OUTPATIENT_CLINIC_OR_DEPARTMENT_OTHER): Payer: Self-pay

## 2024-01-31 ENCOUNTER — Other Ambulatory Visit (HOSPITAL_COMMUNITY): Payer: Self-pay

## 2024-02-25 ENCOUNTER — Other Ambulatory Visit (HOSPITAL_BASED_OUTPATIENT_CLINIC_OR_DEPARTMENT_OTHER): Payer: Self-pay

## 2024-02-25 ENCOUNTER — Encounter (HOSPITAL_BASED_OUTPATIENT_CLINIC_OR_DEPARTMENT_OTHER): Payer: Self-pay | Admitting: Pharmacy Technician

## 2024-02-25 MED ORDER — OXYCODONE HCL 10 MG PO TABS
11.2500 mg | ORAL_TABLET | Freq: Every day | ORAL | 0 refills | Status: AC | PRN
  Filled 2024-02-25 – 2024-02-28 (×2): qty 188, 30d supply, fill #0

## 2024-02-26 ENCOUNTER — Other Ambulatory Visit (HOSPITAL_BASED_OUTPATIENT_CLINIC_OR_DEPARTMENT_OTHER): Payer: Self-pay

## 2024-02-28 ENCOUNTER — Other Ambulatory Visit (HOSPITAL_BASED_OUTPATIENT_CLINIC_OR_DEPARTMENT_OTHER): Payer: Self-pay

## 2024-02-28 ENCOUNTER — Other Ambulatory Visit: Payer: Self-pay

## 2024-02-28 ENCOUNTER — Other Ambulatory Visit (HOSPITAL_COMMUNITY): Payer: Self-pay

## 2024-03-23 ENCOUNTER — Other Ambulatory Visit (HOSPITAL_BASED_OUTPATIENT_CLINIC_OR_DEPARTMENT_OTHER): Payer: Self-pay

## 2024-03-23 ENCOUNTER — Other Ambulatory Visit: Payer: Self-pay

## 2024-03-23 MED ORDER — GABAPENTIN 100 MG PO CAPS
300.0000 mg | ORAL_CAPSULE | Freq: Four times a day (QID) | ORAL | 1 refills | Status: AC
  Filled 2024-03-23: qty 420, 30d supply, fill #0
  Filled 2024-04-24 (×2): qty 420, 30d supply, fill #1

## 2024-03-23 MED ORDER — OXYCODONE HCL 10 MG PO TABS
12.5000 mg | ORAL_TABLET | Freq: Every day | ORAL | 0 refills | Status: DC
  Filled 2024-03-23 – 2024-03-27 (×2): qty 188, 30d supply, fill #0

## 2024-03-24 ENCOUNTER — Other Ambulatory Visit (HOSPITAL_BASED_OUTPATIENT_CLINIC_OR_DEPARTMENT_OTHER): Payer: Self-pay

## 2024-03-27 ENCOUNTER — Other Ambulatory Visit: Payer: Self-pay

## 2024-03-27 ENCOUNTER — Other Ambulatory Visit (HOSPITAL_BASED_OUTPATIENT_CLINIC_OR_DEPARTMENT_OTHER): Payer: Self-pay

## 2024-03-30 ENCOUNTER — Other Ambulatory Visit (HOSPITAL_BASED_OUTPATIENT_CLINIC_OR_DEPARTMENT_OTHER): Payer: Self-pay

## 2024-04-23 ENCOUNTER — Other Ambulatory Visit (HOSPITAL_BASED_OUTPATIENT_CLINIC_OR_DEPARTMENT_OTHER): Payer: Self-pay

## 2024-04-23 MED ORDER — OXYCODONE HCL 10 MG PO TABS
12.5000 mg | ORAL_TABLET | Freq: Every day | ORAL | 0 refills | Status: AC
  Filled 2024-04-27: qty 188, 30d supply, fill #0

## 2024-04-24 ENCOUNTER — Other Ambulatory Visit: Payer: Self-pay

## 2024-04-24 ENCOUNTER — Other Ambulatory Visit (HOSPITAL_BASED_OUTPATIENT_CLINIC_OR_DEPARTMENT_OTHER): Payer: Self-pay

## 2024-04-27 ENCOUNTER — Other Ambulatory Visit (HOSPITAL_BASED_OUTPATIENT_CLINIC_OR_DEPARTMENT_OTHER): Payer: Self-pay

## 2024-04-28 ENCOUNTER — Other Ambulatory Visit (HOSPITAL_BASED_OUTPATIENT_CLINIC_OR_DEPARTMENT_OTHER): Payer: Self-pay
